# Patient Record
Sex: Female | Born: 1975 | Race: Black or African American | Hispanic: No | Marital: Single | Smoking: Never smoker
Health system: Southern US, Community
[De-identification: ages and names within clinical notes are randomized; demographics above are authoritative.]

## PROBLEM LIST (undated history)

## (undated) DIAGNOSIS — K219 Gastro-esophageal reflux disease without esophagitis: Secondary | ICD-10-CM

## (undated) DIAGNOSIS — IMO0002 Reserved for concepts with insufficient information to code with codable children: Secondary | ICD-10-CM

## (undated) DIAGNOSIS — I10 Essential (primary) hypertension: Secondary | ICD-10-CM

## (undated) DIAGNOSIS — Z9289 Personal history of other medical treatment: Secondary | ICD-10-CM

## (undated) DIAGNOSIS — D631 Anemia in chronic kidney disease: Secondary | ICD-10-CM

## (undated) DIAGNOSIS — N042 Nephrotic syndrome with diffuse membranous glomerulonephritis: Secondary | ICD-10-CM

## (undated) DIAGNOSIS — I770 Arteriovenous fistula, acquired: Secondary | ICD-10-CM

## (undated) DIAGNOSIS — N186 End stage renal disease: Secondary | ICD-10-CM

## (undated) DIAGNOSIS — M329 Systemic lupus erythematosus, unspecified: Secondary | ICD-10-CM

## (undated) DIAGNOSIS — N2581 Secondary hyperparathyroidism of renal origin: Secondary | ICD-10-CM

## (undated) DIAGNOSIS — N289 Disorder of kidney and ureter, unspecified: Secondary | ICD-10-CM

## (undated) DIAGNOSIS — D649 Anemia, unspecified: Secondary | ICD-10-CM

## (undated) DIAGNOSIS — Z940 Kidney transplant status: Secondary | ICD-10-CM

## (undated) DIAGNOSIS — D508 Other iron deficiency anemias: Secondary | ICD-10-CM

## (undated) DIAGNOSIS — R002 Palpitations: Secondary | ICD-10-CM

## (undated) HISTORY — DX: Secondary hyperparathyroidism of renal origin: N25.81

## (undated) HISTORY — PX: TRANSPLANTATION RENAL: SUR1385

## (undated) HISTORY — DX: Kidney transplant status: Z94.0

## (undated) HISTORY — DX: Gastro-esophageal reflux disease without esophagitis: K21.9

## (undated) HISTORY — DX: Arteriovenous fistula, acquired: I77.0

## (undated) HISTORY — PX: DILATION AND CURETTAGE OF UTERUS: SHX78

## (undated) HISTORY — DX: Hypomagnesemia: E83.42

## (undated) HISTORY — PX: ABDOMINAL HYSTERECTOMY: SHX81

---
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
## 2002-03-15 ENCOUNTER — Inpatient Hospital Stay (HOSPITAL_COMMUNITY): Admission: AD | Admit: 2002-03-15 | Discharge: 2002-03-15 | Payer: Self-pay | Admitting: Obstetrics and Gynecology

## 2003-12-02 ENCOUNTER — Emergency Department (HOSPITAL_COMMUNITY): Admission: EM | Admit: 2003-12-02 | Discharge: 2003-12-02 | Payer: Self-pay | Admitting: Emergency Medicine

## 2004-03-18 ENCOUNTER — Emergency Department (HOSPITAL_COMMUNITY): Admission: EM | Admit: 2004-03-18 | Discharge: 2004-03-18 | Payer: Self-pay | Admitting: Family Medicine

## 2005-02-15 ENCOUNTER — Ambulatory Visit: Payer: Self-pay | Admitting: Family Medicine

## 2005-05-03 ENCOUNTER — Emergency Department (HOSPITAL_COMMUNITY): Admission: EM | Admit: 2005-05-03 | Discharge: 2005-05-04 | Payer: Self-pay | Admitting: *Deleted

## 2005-05-26 ENCOUNTER — Ambulatory Visit: Payer: Self-pay | Admitting: Internal Medicine

## 2005-05-27 ENCOUNTER — Ambulatory Visit: Payer: Self-pay | Admitting: Internal Medicine

## 2005-06-01 ENCOUNTER — Ambulatory Visit: Payer: Self-pay | Admitting: Internal Medicine

## 2005-06-01 ENCOUNTER — Inpatient Hospital Stay (HOSPITAL_COMMUNITY): Admission: AD | Admit: 2005-06-01 | Discharge: 2005-06-03 | Payer: Self-pay | Admitting: Internal Medicine

## 2005-06-07 ENCOUNTER — Ambulatory Visit: Payer: Self-pay | Admitting: Hospitalist

## 2005-06-09 ENCOUNTER — Encounter (HOSPITAL_COMMUNITY): Admission: RE | Admit: 2005-06-09 | Discharge: 2005-09-07 | Payer: Self-pay | Admitting: Internal Medicine

## 2005-06-14 ENCOUNTER — Ambulatory Visit: Payer: Self-pay | Admitting: Internal Medicine

## 2005-07-14 ENCOUNTER — Ambulatory Visit: Payer: Self-pay | Admitting: Internal Medicine

## 2005-07-26 ENCOUNTER — Ambulatory Visit (HOSPITAL_COMMUNITY): Admission: RE | Admit: 2005-07-26 | Discharge: 2005-07-26 | Payer: Self-pay | Admitting: Nephrology

## 2005-08-03 ENCOUNTER — Ambulatory Visit (HOSPITAL_COMMUNITY): Admission: RE | Admit: 2005-08-03 | Discharge: 2005-08-04 | Payer: Self-pay | Admitting: Nephrology

## 2005-08-03 ENCOUNTER — Encounter (INDEPENDENT_AMBULATORY_CARE_PROVIDER_SITE_OTHER): Payer: Self-pay | Admitting: *Deleted

## 2005-09-08 ENCOUNTER — Encounter (HOSPITAL_COMMUNITY): Admission: RE | Admit: 2005-09-08 | Discharge: 2005-12-07 | Payer: Self-pay | Admitting: Internal Medicine

## 2005-10-19 ENCOUNTER — Ambulatory Visit: Payer: Self-pay | Admitting: Internal Medicine

## 2005-11-04 ENCOUNTER — Ambulatory Visit: Payer: Self-pay | Admitting: Internal Medicine

## 2005-12-08 ENCOUNTER — Encounter (HOSPITAL_COMMUNITY): Admission: RE | Admit: 2005-12-08 | Discharge: 2006-03-08 | Payer: Self-pay | Admitting: Nephrology

## 2006-06-14 DIAGNOSIS — D631 Anemia in chronic kidney disease: Secondary | ICD-10-CM

## 2006-06-14 DIAGNOSIS — N039 Chronic nephritic syndrome with unspecified morphologic changes: Secondary | ICD-10-CM

## 2006-06-14 HISTORY — DX: Anemia in chronic kidney disease: D63.1

## 2006-08-14 ENCOUNTER — Encounter (INDEPENDENT_AMBULATORY_CARE_PROVIDER_SITE_OTHER): Payer: Self-pay | Admitting: Internal Medicine

## 2006-08-14 ENCOUNTER — Ambulatory Visit: Payer: Self-pay | Admitting: *Deleted

## 2006-08-14 LAB — CONVERTED CEMR LAB
BUN: 13 mg/dL (ref 6–23)
CO2: 23 meq/L (ref 19–32)
Calcium: 7.6 mg/dL — ABNORMAL LOW (ref 8.4–10.5)
Chloride: 113 meq/L — ABNORMAL HIGH (ref 96–112)
Creatinine, Ser: 1.17 mg/dL (ref 0.40–1.20)
Ferritin: 9 ng/mL — ABNORMAL LOW (ref 10–291)
Glucose, Bld: 83 mg/dL (ref 70–99)
HCT: 28.3 % — ABNORMAL LOW (ref 34.4–43.3)
Hemoglobin: 8.4 g/dL — ABNORMAL LOW (ref 11.7–14.8)
Iron: <10 ug/dL — ABNORMAL LOW (ref 42–145)
MCHC: 29.7 g/dL — ABNORMAL LOW (ref 33.1–35.4)
MCV: 75.9 fL — ABNORMAL LOW (ref 78.8–100.0)
Platelets: 282 10*3/uL (ref 152–374)
Potassium: 4.8 meq/L (ref 3.5–5.3)
RBC: 3.73 M/uL — ABNORMAL LOW (ref 3.79–4.96)
RDW: 17.5 % — ABNORMAL HIGH (ref 11.5–15.3)
Sodium: 139 meq/L (ref 135–145)
UIBC: 187 ug/dL
WBC: 3.1 10*3/uL — ABNORMAL LOW (ref 3.7–10.0)

## 2006-08-21 DIAGNOSIS — N042 Nephrotic syndrome with diffuse membranous glomerulonephritis, unspecified: Secondary | ICD-10-CM

## 2006-08-21 DIAGNOSIS — M329 Systemic lupus erythematosus, unspecified: Secondary | ICD-10-CM | POA: Insufficient documentation

## 2006-08-21 DIAGNOSIS — D508 Other iron deficiency anemias: Secondary | ICD-10-CM | POA: Insufficient documentation

## 2006-08-21 HISTORY — DX: Other iron deficiency anemias: D50.8

## 2006-08-21 HISTORY — DX: Nephrotic syndrome with diffuse membranous glomerulonephritis: N04.2

## 2006-08-21 HISTORY — DX: Nephrotic syndrome with diffuse membranous glomerulonephritis, unspecified: N04.20

## 2006-08-21 HISTORY — DX: Systemic lupus erythematosus, unspecified: M32.9

## 2007-01-18 ENCOUNTER — Encounter (HOSPITAL_COMMUNITY): Admission: RE | Admit: 2007-01-18 | Discharge: 2007-04-18 | Payer: Self-pay | Admitting: Nephrology

## 2007-06-19 ENCOUNTER — Emergency Department (HOSPITAL_COMMUNITY): Admission: EM | Admit: 2007-06-19 | Discharge: 2007-06-19 | Payer: Self-pay | Admitting: Emergency Medicine

## 2008-05-29 ENCOUNTER — Encounter (HOSPITAL_COMMUNITY): Admission: RE | Admit: 2008-05-29 | Discharge: 2008-08-27 | Payer: Self-pay | Admitting: Nephrology

## 2008-06-19 ENCOUNTER — Ambulatory Visit: Payer: Self-pay | Admitting: Gastroenterology

## 2008-10-21 ENCOUNTER — Encounter (HOSPITAL_COMMUNITY): Admission: RE | Admit: 2008-10-21 | Discharge: 2009-01-19 | Payer: Self-pay | Admitting: Nephrology

## 2008-10-28 ENCOUNTER — Other Ambulatory Visit: Admission: RE | Admit: 2008-10-28 | Discharge: 2008-10-28 | Payer: Self-pay | Admitting: Obstetrics and Gynecology

## 2009-01-22 ENCOUNTER — Ambulatory Visit (HOSPITAL_COMMUNITY): Admission: RE | Admit: 2009-01-22 | Discharge: 2009-01-22 | Payer: Self-pay | Admitting: Obstetrics and Gynecology

## 2009-01-22 ENCOUNTER — Encounter (INDEPENDENT_AMBULATORY_CARE_PROVIDER_SITE_OTHER): Payer: Self-pay | Admitting: Obstetrics and Gynecology

## 2009-03-18 ENCOUNTER — Encounter (HOSPITAL_COMMUNITY): Admission: RE | Admit: 2009-03-18 | Discharge: 2009-06-16 | Payer: Self-pay | Admitting: Nephrology

## 2009-06-25 ENCOUNTER — Encounter (HOSPITAL_COMMUNITY): Admission: RE | Admit: 2009-06-25 | Discharge: 2009-09-23 | Payer: Self-pay | Admitting: Nephrology

## 2010-07-16 ENCOUNTER — Ambulatory Visit: Payer: Self-pay | Admitting: Vascular Surgery

## 2010-08-25 ENCOUNTER — Ambulatory Visit (HOSPITAL_COMMUNITY)
Admission: RE | Admit: 2010-08-25 | Discharge: 2010-08-25 | Payer: Self-pay | Source: Home / Self Care | Attending: Vascular Surgery | Admitting: Vascular Surgery

## 2010-08-25 HISTORY — PX: AV FISTULA PLACEMENT: SHX1204

## 2010-09-24 ENCOUNTER — Ambulatory Visit
Admission: RE | Admit: 2010-09-24 | Discharge: 2010-09-24 | Payer: Self-pay | Source: Home / Self Care | Attending: Vascular Surgery | Admitting: Vascular Surgery

## 2010-09-24 NOTE — Assessment & Plan Note (Addendum)
OFFICE VISIT  COLLEENE, SWARTHOUT DOB:  07/08/1976                                       09/24/2010 UVOZD#:[id number]  This is a postop followup.  HISTORY OF PRESENT ILLNESS:  This is a 35 year old female that underwent a left brachiocephalic arteriovenous fistula on 08/25/2010 who presents for followup evaluation.  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 Our plan was to try to get a working fistula in her left arm. Unfortunately, it looks like the left cephalic vein in the upper arm was noted to be marginal and on the intraoperative exploration there was a sclerotic 3 to 4 mm cephalic vein that I felt was still worth attempting.  At this point the patient is having intermittently some numbness in her left forearm but no fingertip numbness and is able to complete her activities of daily living without any problems.  PHYSICAL EXAMINATION:  She had no vital signs obtained.  On focused exam her left arm, she has full hand grip with intact sensation in her fingertips in this left hand.  The incision in this left arm as well healed.  There is a palpable thrill at the antecubitum, however, I do not feel a thrill elsewhere.  Also, I hear a weak bruit in the upper arm not to surprised giving the obesity she has in her upper arm.  I obtained a SonoSite to evaluate this brachiocephalic arteriovenous fistula.  Evaluating the entire length of this vein it appears to be widely patent but still very small.  There are some segments that remain only about 3 mm in diameter.  There is no thrombus throughout this arteriovenous fistula.  However, at some areas this vein is more than 2 inches deep.  MEDICAL DECISION MAKING:  This is a 35 year old female with a patent left brachiocephalic arteriovenous fistula.  Unfortunately this fistula does not appear to be maturing adequately additionally.   Additionally it is quite deep, at some points greater than 2 cm so even if it does mature she will need a superficialization procedure.  At this point luckily she is still in chronic kidney disease stage 3 and so does not immediately need any intervention.  I would just continue to let her mature.  She is going to follow up in another month and then I will have her access evaluated in the peripheral lab.  I would not be surprised if this access thromboses given that at some areas it is only 3 mm in diameter.  I discussed this with the patient and she understands the plan.  We do not intend to place any graft in this patient until she is within 2 to 3 months of needing hemodialysis.    Fransisco Hertz, MD Electronically Signed  BLC/MEDQ  D:  09/24/2010  T:  09/24/2010  Job:  [id number]

## 2010-10-22 ENCOUNTER — Encounter (INDEPENDENT_AMBULATORY_CARE_PROVIDER_SITE_OTHER): Payer: Self-pay

## 2010-10-22 ENCOUNTER — Ambulatory Visit (INDEPENDENT_AMBULATORY_CARE_PROVIDER_SITE_OTHER): Payer: Self-pay | Admitting: Vascular Surgery

## 2010-10-22 DIAGNOSIS — T82598A Other mechanical complication of other cardiac and vascular devices and implants, initial encounter: Secondary | ICD-10-CM

## 2010-10-22 DIAGNOSIS — N186 End stage renal disease: Secondary | ICD-10-CM

## 2010-10-22 NOTE — Assessment & Plan Note (Signed)
OFFICE VISIT  Cynthia Pugh, Cynthia Pugh DOB:  February 11, 1976                                       10/22/2010 ZOXWR#:[id number]  This is a postop followup.  HISTORY OF PRESENT ILLNESS:  This is a 35 year old female that previously underwent a left brachiocephalic arteriovenous fistula at the end of December 2011.  I had seen her previously in January for postop followup on her fistula.  At that point there were segments that remained about 3 mm in diameter and I wanted to give her some additional time to dilate further.  However, on evaluation today now it appears to be occluded.  She has had no steal symptoms and the hand has some radial side numbness in the skin occasionally.  She is able to complete her activities of daily living without any difficulties.  PHYSICAL EXAMINATION:  Vital signs:  Today she had a temperature 97.6, blood pressure 137/99, a heart rate of 80, respirations were 20.  On focused examination of the left arm her incision has healed up well. There is 5/5 hand grip in this left arm with sensation intact in her fingers.  She has good intrinsic extrinsic muscle strength in this hand. I do not feel a thrill throughout this fistula at this point.  Additionally on noninvasive vascular imaging there was a left arm fistula duplex completed.  There is a focal area of thrombosis near the arterial anastomosis though most of the vein appears to be patent.  MEDICAL DECISION MAKING:  This is a 35 year old female status post left brachiocephalic arteriovenous fistula.  There appears to be an occlusion at the arterial anastomoses.  Reviewing the operative notes this would correspond to the sclerotic vein segment that I had to harvest in her case to achieve adequate length for this fistula.  While it may be possible to thrombolize this my suspicion is that eventually it would resclerose as this is fundamentally damaged vein.  So unfortunately I would consider  this a failed brachiocephalic arteriovenous fistula attempt and another attempt would be futile as eventually the damaged vein will once again sclerose.  In reviewing her previous vein mapping she has no other fistula options I believe in the left arm.  The right arm had a possibility of basilic vein transposition and so subsequently I would proceed forward with a right arm venogram with dilute dye and central venogram to determine whether this is indeed a realistic option in this patient.  She is currently not on end-stage renal and based on her previous numbers was not even in chronic kidney disease stage III so we have some time to determine whether or not this basilic vein is an option.  I am going to schedule her for Thursday March 8 for venogram to determine whether this is a feasible fistula option.  If we are left with no fistula options then at that point I would await her renal disease progressing until she is 1-2 months out from needing dialysis so at that point we can place arteriovenous graft given the limited patencies of graft in these patients.  Thank you for giving [place] the opportunity to participate in this patient's care.    Fransisco Hertz, MD Electronically Signed  BLC/MEDQ  D:  10/22/2010  T:  10/22/2010  Job:  [id number]

## 2010-10-28 NOTE — Procedures (Unsigned)
VASCULAR LAB EXAM  INDICATION:  Nonmaturing AV fistula.  HISTORY: Diabetes: Cardiac: Hypertension:  EXAM:  Left upper arm AV fistula duplex.  IMPRESSION: 1. The left brachial to cephalic arteriovenous fistula does not appear     to be patent at the antecubital fossa of the outflow vein with the     remainder of the outflow vein throughout the left brachium level     appearing patent with continuous venous flow.  The left     arteriovenous fistula anastomosis demonstrates turbulent to-and-fro     flow. 2. Diameter, depth and velocity measurements are noted on the attached     worksheet.  ___________________________________________ Fransisco Hertz, MD  CH/MEDQ  D:  10/22/2010  T:  10/22/2010  Job:  [id number]

## 2010-11-04 ENCOUNTER — Ambulatory Visit (HOSPITAL_COMMUNITY)
Admission: RE | Admit: 2010-11-04 | Discharge: 2010-11-04 | Disposition: A | Discharge status: Home / Self Care | Payer: Self-pay | Source: Ambulatory Visit | Attending: Vascular Surgery | Admitting: Vascular Surgery

## 2010-11-04 DIAGNOSIS — M329 Systemic lupus erythematosus, unspecified: Secondary | ICD-10-CM | POA: Insufficient documentation

## 2010-11-04 DIAGNOSIS — N039 Chronic nephritic syndrome with unspecified morphologic changes: Secondary | ICD-10-CM | POA: Insufficient documentation

## 2010-11-04 DIAGNOSIS — N186 End stage renal disease: Secondary | ICD-10-CM

## 2010-11-04 DIAGNOSIS — N183 Chronic kidney disease, stage 3 unspecified: Secondary | ICD-10-CM | POA: Insufficient documentation

## 2010-11-04 DIAGNOSIS — I120 Hypertensive chronic kidney disease with stage 5 chronic kidney disease or end stage renal disease: Secondary | ICD-10-CM

## 2010-11-04 LAB — POCT I-STAT, CHEM 8
Chloride: 112 mEq/L (ref 96–112)
Creatinine, Ser: 2.6 mg/dL — ABNORMAL HIGH (ref 0.4–1.2)
Glucose, Bld: 87 mg/dL (ref 70–99)
HCT: 33 % — ABNORMAL LOW (ref 36.0–46.0)
Hemoglobin: 11.2 g/dL — ABNORMAL LOW (ref 12.0–15.0)
Sodium: 139 mEq/L (ref 135–145)
TCO2: 19 mmol/L (ref 0–100)

## 2010-11-07 NOTE — Op Note (Signed)
[name], Cynthia Pugh NO.:  [id number]  MEDICAL RECORD NO.:  [id number]           PATIENT TYPE:  O  LOCATION:  [facility]                         FACILITY:  [facility]  PHYSICIAN:  Fransisco Hertz, MD       DATE OF BIRTH:  1975/12/25  DATE OF PROCEDURE:  11/04/2010 DATE OF DISCHARGE:  11/04/2010                              OPERATIVE REPORT   PROCEDURE:  Right arm and central venogram.  PREOPERATIVE DIAGNOSIS:  Chronic kidney disease, stage III.  POSTOPERATIVE DIAGNOSIS:  Chronic kidney disease, stage III.  SURGEON:  Fransisco Hertz, MD  ANESTHESIA:  None.  CONTRAST:  20 mL.  ESTIMATED BLOOD LOSS:  Minimal.  FINDINGS: 1. No cephalic vein filling. 2. Distal brachial veins are patent distally, with one occluding half way up     the arm, this is the one that the basilic vein drains into.  The other     one tapers down to less than 1 mm for for a 2-3 cm segment  proximally. 3. Basilic vein drains into the one of the brachial veins about third     to the way up the arm. 4. The axillary and subclavian veins on the right side are patent. 5. There is a dilated proximal subclavian vein which obscures the central     venous flow, but the innominate and superior vena cava appeared to     be patent.  INDICATIONS:  This is a 35 year old female who has chronic kidney disease related to lupus.  She previously has undergone  a left brachiocephalic arteriovenous fistula which initially was patent, but eventually thrombosed.  Unfortunately, she has no other veins of adequate diameter in this left arm.  Prior to proceeding with a basilic vein transposition on the right arm, I felt it was good idea to make an attempt at imaging this vein, so she was brought back to the angio suite today for right arm venogram and central venography.  The patient is aware of the risks of this procedure including anaphylactic reaction to the dye and possible exacerbation of her renal failure with the  contrast dye.  She is aware of these risks and agreed to proceed forward such.  DESCRIPTION OF OPERATION:  After full informed written consent had been obtained from the patient, she was brought back to the angio suite and placed supine upon the angio table.  She was connected to monitoring equipment.  Her previously placed right hand IV was connected to IV extension tubing.  Under fluoroscopic imaging, hand injections were completed to image the upper arm and the central veins, findings of which were as listed above.  Based on these findings, I do not think that a basilic vein transposition would successively mature in her as the basilic vein is drains into the specific brachial vein and drains into its thrombosed more proximally.  So, our only option in this arm is also to proceed forward with graft and subsequently we will hold off on any access until she nears end-stage renal disease.  COMPLICATIONS:  None.  CONDITION:  Stable.     Fransisco Hertz,  MD     BLC/MEDQ  D:  11/04/2010  T:  11/05/2010  Job:  [id number] Electronically Signed by Leonides Sake MD on 11/07/2010 06:33:38 PM

## 2010-11-08 LAB — POCT I-STAT 4, (NA,K, GLUC, HGB,HCT)
Glucose, Bld: 95 mg/dL (ref 70–99)
HCT: 33 % — ABNORMAL LOW (ref 36.0–46.0)
Hemoglobin: 11.2 g/dL — ABNORMAL LOW (ref 12.0–15.0)
Potassium: 4.3 mEq/L (ref 3.5–5.1)
Sodium: 143 mEq/L (ref 135–145)

## 2010-11-08 LAB — SURGICAL PCR SCREEN
MRSA, PCR: NEGATIVE
Staphylococcus aureus: NEGATIVE

## 2010-12-02 LAB — IRON AND TIBC
Iron: 85 ug/dL (ref 42–135)
Saturation Ratios: 30 % (ref 20–55)
TIBC: 284 ug/dL (ref 250–470)
UIBC: 199 ug/dL

## 2010-12-02 LAB — POCT HEMOGLOBIN-HEMACUE
Hemoglobin: 13.1 g/dL (ref 12.0–15.0)
Hemoglobin: 11.2 g/dL — ABNORMAL LOW (ref 12.0–15.0)
Hemoglobin: 11.6 g/dL — ABNORMAL LOW (ref 12.0–15.0)

## 2010-12-02 LAB — FERRITIN: Ferritin: 71 ng/mL (ref 10–291)

## 2010-12-03 LAB — SICKLE CELL SCREEN: Sickle Cell Screen: NEGATIVE

## 2010-12-03 LAB — POCT HEMOGLOBIN-HEMACUE: Hemoglobin: 9.3 g/dL — ABNORMAL LOW (ref 12.0–15.0)

## 2010-12-05 LAB — POCT HEMOGLOBIN-HEMACUE
Hemoglobin: 9.3 g/dL — ABNORMAL LOW (ref 12.0–15.0)
Hemoglobin: 9.4 g/dL — ABNORMAL LOW (ref 12.0–15.0)

## 2010-12-07 LAB — ABO/RH: ABO/RH(D): B POS

## 2010-12-07 LAB — TYPE AND SCREEN
ABO/RH(D): B POS
Antibody Screen: NEGATIVE

## 2010-12-07 LAB — BASIC METABOLIC PANEL WITH GFR
BUN: 15 mg/dL (ref 6–23)
CO2: 21 meq/L (ref 19–32)
Calcium: 8.3 mg/dL — ABNORMAL LOW (ref 8.4–10.5)
Chloride: 114 meq/L — ABNORMAL HIGH (ref 96–112)
Creatinine, Ser: 1.61 mg/dL — ABNORMAL HIGH (ref 0.4–1.2)
GFR calc non Af Amer: 38 mL/min — ABNORMAL LOW
Glucose, Bld: 101 mg/dL — ABNORMAL HIGH (ref 70–99)
Potassium: 3.8 meq/L (ref 3.5–5.1)
Sodium: 140 meq/L (ref 135–145)

## 2010-12-07 LAB — CBC
Hemoglobin: 7.2 g/dL — CL (ref 12.0–15.0)
MCHC: 32.7 g/dL (ref 30.0–36.0)
Platelets: 324 10*3/uL (ref 150–400)
RDW: 17.9 % — ABNORMAL HIGH (ref 11.5–15.5)

## 2010-12-07 LAB — PROTIME-INR: INR: 1.1 (ref 0.00–1.49)

## 2010-12-07 LAB — APTT: aPTT: 28 seconds (ref 24–37)

## 2010-12-07 LAB — HCG, SERUM, QUALITATIVE: Preg, Serum: NEGATIVE

## 2010-12-08 LAB — POCT HEMOGLOBIN-HEMACUE: Hemoglobin: 7.7 g/dL — CL (ref 12.0–15.0)

## 2010-12-09 LAB — CBC
Hemoglobin: 9.3 g/dL — ABNORMAL LOW (ref 12.0–15.0)
MCHC: 33 g/dL (ref 30.0–36.0)
MCV: 72.3 fL — ABNORMAL LOW (ref 78.0–100.0)
RBC: 3.91 MIL/uL (ref 3.87–5.11)
WBC: 6.1 10*3/uL (ref 4.0–10.5)

## 2010-12-09 LAB — RENAL FUNCTION PANEL
CO2: 19 mEq/L (ref 19–32)
Chloride: 115 mEq/L — ABNORMAL HIGH (ref 96–112)
Creatinine, Ser: 1.58 mg/dL — ABNORMAL HIGH (ref 0.4–1.2)
GFR calc Af Amer: 46 mL/min — ABNORMAL LOW (ref >60)
GFR calc non Af Amer: 38 mL/min — ABNORMAL LOW (ref >60)
Glucose, Bld: 103 mg/dL — ABNORMAL HIGH (ref 70–99)
Sodium: 138 mEq/L (ref 135–145)

## 2010-12-09 LAB — POCT HEMOGLOBIN-HEMACUE: Hemoglobin: 9 g/dL — ABNORMAL LOW (ref 12.0–15.0)

## 2010-12-14 LAB — CROSSMATCH: Antibody Screen: NEGATIVE

## 2010-12-14 LAB — RENAL FUNCTION PANEL
Albumin: 2.6 g/dL — ABNORMAL LOW (ref 3.5–5.2)
Calcium: 8 mg/dL — ABNORMAL LOW (ref 8.4–10.5)
Creatinine, Ser: 1.88 mg/dL — ABNORMAL HIGH (ref 0.4–1.2)
GFR calc Af Amer: 38 mL/min — ABNORMAL LOW (ref >60)
GFR calc non Af Amer: 31 mL/min — ABNORMAL LOW (ref >60)
Phosphorus: 3.9 mg/dL (ref 2.3–4.6)
Sodium: 138 mEq/L (ref 135–145)

## 2010-12-14 LAB — POCT HEMOGLOBIN-HEMACUE: Hemoglobin: 6.4 g/dL — CL (ref 12.0–15.0)

## 2011-01-11 NOTE — Procedures (Signed)
CEPHALIC VEIN MAPPING   INDICATION:  Preoperative AVG placement.   HISTORY:  End-stage renal disease.   EXAM:  The right cephalic vein is compressible with diameter measurements  ranging from 0.16 to 0.23 cm.   The right basilic vein is compressible with diameter measurements  ranging from 0.29 to 0.34 cm.   The left cephalic vein is compressible with diameter measurements  ranging from 0.19 to 0.31 cm.   The left basilic vein is compressible with diameter measurements ranging  from 0.20 to 0.26 cm.   See attached worksheet for all measurements.   IMPRESSION:  Patent right and left cephalic and basilic veins with  diameter measurements, as described above.   ___________________________________________  Leonides Sake, MD   EM/MEDQ  D:  07/16/2010  T:  07/16/2010  Job:  [id number]

## 2011-01-11 NOTE — Consult Note (Signed)
NEW PATIENT CONSULTATION   Pugh, Cynthia  DOB:  [birth year]                                       07/16/2010  ZOXWR#:[id number]   REASON FOR REQUEST:  Placement of new access.   HISTORY OF PRESENT ILLNESS:  This is a 35 year old female, left hand-  dominant, who presents with chronic kidney disease stage III.  Presents  with chief a complaint of evaluation for new access.  This patient never  has had any access, no previous central venous catheter.  By report she  has a combination issues resulting in her kidney failure including  membranous nephropathy and nephrotic syndrome.  While initially her  baseline creatinine was 1.4, now at the most recent creatinine was up to  2.4, which corresponded to an estimated GFR of 30 mL per minute.  Dr.  Hyman Hopes refers this patient to [place] for evaluation for possible fistula  placement.  At this point she mentioned she notes no problems in terms  of any sensory deficiencies in either hand, no motor deficiencies and  denies any problems of any type of bleeding disorders.   PAST MEDICAL HISTORY:  1. Membranous nephropathy.  2. Endometrial precancerous lesion.  3. Hypertension.  4. Secondary hyperparathyroidism.  5. Nephrotic syndrome.  6. Hyperlipidemia.   PAST SURGICAL HISTORY:  By report she has had a hysterectomy of some  type.   SOCIAL HISTORY:  She denies any tobacco, alcohol or illicit drug use.   FAMILY HISTORY:  She was not able to remember if her father or mother  had any medical problems.   MEDICATIONS:  She did not note being on any medications and she notes  her only allergy to be aspirin.   REVIEW OF SYSTEMS:  She noted anemia, kidney disease and frequent  urination.   PHYSICAL EXAMINATION:  VITAL SIGNS:  Blood pressure 135/95, a heart rate  of 92, respirations were 12.  GENERAL:  She is alert and oriented x3.  She is obese.  Otherwise well-  developed.  HEAD:  Normocephalic, atraumatic.  ENT:  Hearing is  grossly intact.  Nares without any erythema or  drainage.  OROPHARYNX:  Without any erythema or exudate.  EYES:  Pupils equal, round, reactive to light.  Extraocular movements  are intact.  NECK:  Supple.  No nuchal rigidity.  No palpable lymphadenopathy.  PULMONARY:  Symmetric expansion, good air movement.  Clear to  auscultation bilaterally.  No rales, rhonchi or wheezing.  CARDIAC:  Regular rate and rhythm.  Normal S1 and S2.  No murmurs, rubs,  thrills or gallops.  VASCULAR:  She had palpable pulses in all extremities.  Her carotids did  not have any carotid bruits and were easily palpable.  I could not  appreciate her aorta due to her obesity.  GI:  Soft, nontender, nondistended, large pannus.  No guarding or  rebound.  No hepatosplenomegaly.  No masses.  No costovertebral angle  tenderness.  MUSCULOSKELETAL:  All extremities had 5/5 strength.  There was no  evidence of ulcerations or gangrene in any extremity.  NEUROLOGIC:  Cranial nerves II-XII were intact.  Pain and light touch  were intact in all extremities.  She had good sensation in her hands  with good intrinsic, extrinsic motor strength in the hands.  Motor exam  was as listed above.  PSYCH:  Judgment was intact.  Her mood and affect were appropriate for  her clinical situation.  SKIN:  She did not have any rashes elsewhere.  The extremities were as  listed above.  LYMPHATIC:  She had no cervical, axillary or inguinal lymphadenopathy.   NONINVASIVE VASCULAR IMAGING:  She had bilateral upper extremity vein  mapping.  On the right side her cephalic vein is inadequate for any  fistula.  The basilic vein also is marginal for a basilic vein  transposition.  On her left side, her dominant side, the cephalic vein  in the upper arm ranges 0.27 to 0.31, may be a suitable possibly for a  brachiocephalic arteriovenous fistula.  The basilic vein on the left  side is inadequate for a BVT.   MEDICAL DECISION-MAKING:  This is a  35 year old female who is now  chronic kidney disease stage III due to her noncompliance.  Per her  nephrologist, the patient is going to eventually require dialysis and  would like a fistula placement.  Unfortunately, her veins are extremely  poor.  Her only option is a brachiocephalic in the left arm.  Unfortunately, this vein also is a little bit marginal.  I think it is  worth trying to place the fistula in her left arm.  The other concern is  going to be due to her obesity, while we may be to get a viable  brachiocephalic fistula, I suspect she is still going to require a  second-stage superficialization of this vein, but we discussed our  options her.  I would do only a brachiocephalic arteriovenous fistula  because her renal function has not deteriorate to the point where she  requires hemodialysis in the next two months.  If she looks like she is  going to be within 2 months of needing a dialysis, at that point if she  needs a graft then we will place the graft due to the limitations of  durability with arteriovenous grafts.  We just had an extensive  discussion of the risks of access surgery, which included but were not  limited to nerve damage, steal syndrome, ischemic monomelic neuropathy,  infection, possible bleeding, non-maturity and possible need for  additional procedures.  She is aware of such.  She is going to discuss  this with Dr. Hyman Hopes and when she feels comfortable proceeding, she is  going to let us know and we will place a left brachiocephalic  arteriovenous fistula.     Leonides Sake, MD  Electronically Signed   BC/MEDQ  D:  07/16/2010  T:  07/19/2010  Job:  [id number]

## 2011-01-11 NOTE — Op Note (Signed)
NAMEBUELAH, Cynthia NO.:  [id number]   MEDICAL RECORD NO.:  [id number]          PATIENT TYPE:  AMB   LOCATION:  [facility]                           FACILITY:  [facility]   PHYSICIAN:  Gerald Leitz, MD          DATE OF BIRTH:  1975-10-05   DATE OF PROCEDURE:  01/22/2009  DATE OF DISCHARGE:  01/22/2009                               OPERATIVE REPORT   PREOPERATIVE DIAGNOSES:  1. Menorrhagia.  2. Endometrial polyps.  3. Anemia.   POSTOPERATIVE DIAGNOSES:  1. Menorrhagia.  2. Endocervical polyp.  3. Anemia.   PROCEDURE:  Hysteroscopy, dilatation and curettage, removal of  endocervical polyp.   SURGEON:  Gerald Leitz, MD   ASSISTANT:  None.   ANESTHESIA:  General.   FINDINGS:  Two large endocervical polyps, one approximately 2.5 cm in  length.   SPECIMENS:  Endocervical polyps and endometrial curettings.   DISPOSITION OF SPECIMEN:  Pathology.   ESTIMATED BLOOD LOSS:  150 mL.   COMPLICATIONS:  None.   SORBITOL DEFICIT:  100 mL.   PROCEDURE IN DETAIL:  The patient was taken to the operating room where  she was placed under general anesthesia, she was placed in a dorsal  lithotomy position and prepped and draped in the usual sterile fashion.  A speculum was placed into the vaginal vault.  The anterior lip of the  cervix was grasped with a single-tooth tenaculum.  The uterus was then  sounded to 8 cm.  The cervix was dilated to approximately 6 mm and the  diagnostic hysteroscope was inserted without difficulty with the  findings noted above.  Endoloop was placed around the large endocervical  polyp to help with hemostasis, and the endometrial polyp was then  transected and handed off.  The small endocervical polyp was removed  with polyp forceps without difficulty.  Sharp curettage was performed  until a gritty texture was noted.  Hysteroscope was reinserted.  No  evidence of perforation was noted.  Single-tooth tenaculum was removed.  The patient was then noted to  have some brisk bleeding from the  tenaculum site.  There was also bleeding from the endocervical site.  Pressure was applied.  Hemostasis was not noted at this point.  Silver  nitrate was applied to the tenaculum site.  Again hemostasis failed.  Stitch of 2-0 Vicryl was placed along the tenaculum site with continued  bleeding and oozing from the anterior cervical lip.  Monsel was applied  and then excellent hemostasis was noted.  The speculum was removed from  the vagina.  The patient was taken to recovery room awake and in stable  condition.      Gerald Leitz, MD  Electronically Signed     TC/MEDQ  D:  01/22/2009  T:  01/22/2009  Job:  [id number]

## 2011-01-14 NOTE — Group Therapy Note (Signed)
NAME:  Cynthia Pugh, Cynthia Pugh NO.:  [id number]   MEDICAL RECORD NO.:  [id number]          PATIENT TYPE:  WOC   LOCATION:  [facility]                   FACILITY:  [facility]   PHYSICIAN:  Tinnie Gens, MD        DATE OF BIRTH:  18-Jun-1976   DATE OF SERVICE:                                    CLINIC NOTE   CHIEF COMPLAINT:  Yearly exam.   HISTORY OF PRESENT ILLNESS:  The patient is a 35 year old nulligravida  virgin who is self referred.  She comes in needing a Pap smear.  It has been  a few years since she had one, but she is desiring this exam.  With her is a  very dear friend who says they really want her to be checked for lupus.  Apparently, her sister died of systemic lupus in December 12, 2022, and this was an  identical twin.  The patient states that her sister had lung involvement,  and was significantly short of breath.  The patient herself has no  significant shortness of breath.   PAST MEDICAL HISTORY:  Negative.   PAST SURGICAL HISTORY:  Also negative.   MEDICATIONS:  None.   ALLERGIES:  To aspirin.   OBSTETRICAL HISTORY:  G0.   GYNECOLOGICAL HISTORY:  Menarche at age 78.  Cycles every 25-30 days with  heavy flow the first 2 days and pain with no history of abnormal Paps.   FAMILY HISTORY:  Significant only for lupus.  Otherwise is unknown as she  and her sister were adopted.   SOCIAL HISTORY:  No tobacco, alcohol or drug use.   REVIEW OF SYSTEMS:  A 14-point review of systems was reviewed.  She states  she has some muscle cramps at night in her legs, and occasionally has a  dizzy spell.   PHYSICAL EXAMINATION:  VITAL SIGNS:  Her vitals are as noted on the chart.  GENERAL:  She is a well-developed, well-nourished black female in no acute  distress.  GU:  She has an intact hymenal ring.  The vagina is pink and rugated.  The  cervix is without lesions.  The uterus on one-finger digital exam is small  and anteverted.  There are no adnexal masses or tenderness.   IMPRESSION:  1.  Yearly exam.  2.  Heavy cycles.  3.  Strong family history of lupus.   PLAN:  1.  Pap smear today.  2.  Start her on Lorcet 1 p.o. daily.  3.  Check ANA today.  If her ANA comes back positive, we will refer her to      internal medicine.  Otherwise the patient should follow up in a year for      a refill on her pills or sooner if she is having problems with that.  4.  Other medical problems should be followed by an internist as well.       TP/MEDQ  D:  02/15/2005  T:  02/15/2005  Job:  [id number]

## 2011-01-14 NOTE — Discharge Summary (Signed)
NAMEANDEE, CHIVERS NO.:  [id number]   MEDICAL RECORD NO.:  [id number]          PATIENT TYPE:  INP   LOCATION:  3037                         FACILITY:  [facility]   PHYSICIAN:  Duncan Dull, M.D.     DATE OF BIRTH:  Jan 13, 1976   DATE OF ADMISSION:  06/01/2005  DATE OF DISCHARGE:  06/03/2005                                 DISCHARGE SUMMARY   DISCHARGE DIAGNOSES:  1.  Anemia.  2.  Systemic lupus erythematosus.  3.  Renal insufficiency.  4.  Hypokalemia.  5.  Menorrhagia.   DISCHARGE MEDICATION:  Ferrous sulfate 325 mg p.o. t.i.d.   DISPOSITION AND FOLLOW-UP:  Discharged to home in improved condition.  Follow-up is with Dr. Renae Fickle.  At that time labs were to be checked.  A CBC  and BMP to be drawn.   PROCEDURES PERFORMED:  1.  Pelvic ultrasound on June 03, 2005:  Small bilateral benign appearing      ovarian cyst.  2.  Renal ultrasound June 03, 2005:  Slight enlarged bilateral kidneys,      otherwise normal.   CONSULTATIONS:  None.   ADMISSION HISTORY AND PHYSICAL:   HISTORY OF PRESENT ILLNESS:  Patient is a 35 year old African American  female with history of anemia and heavy menstrual bleeding presenting with  worsening anemia.  Patient is a new patient of the medical clinic.  After  first visit, patient was evaluated for possible coagulopathy which was  negative and ESR checked for possible lupus as her twin sister passed away  in 12-09-2004, for multiple organ system failure due to lupus.  Patient  states that she has had long history of anemia, has been told this as a  teenager.  Patient passed out in January 2006, was evaluated at ER and  discharged with iron supplements which she admits to not being compliant  with.  Currently the patient complains of fatigue, occasional shortness of  breath, dizziness, and currently on her menstrual period with heavy  bleeding.  States she goes through four to six pads per day and flow is  heaviest on the  second and third days.  She has had heavy bleeding with  periods since menarche and was on birth control as a teenager for two to  three years to control flow.  She is currently not on birth control and her  flow is heavy but regular.  Denies palpitations, chest pain, fever, chills,  blood in urine or stool.  She has no pain, nausea or vomiting.  She has a  poor appetite and cold intolerance.   ALLERGIES:  ASPIRIN, reaction is unknown.   PAST MEDICAL HISTORY:  Possible bronchitis in May 04, 2004.   PHYSICAL EXAMINATION ON ADMISSION:  GENERAL APPEARANCE:  She is without  distress.  She appeared somewhat fatigued.  VITAL SIGNS:  Temperature 98.7, blood pressure 116/71, pulse 105,  respiratory rate 16.  HEENT:  Pupils are equal, round, reactive to light and accommodation.  Her  extraocular muscles were intact.  ENT:  Poor dentition.  NECK:  Supple.  RESPIRATORY:  Clear to  auscultation bilaterally.  CARDIOVASCULAR:  Tachycardic, slight systolic ejection murmur.  ABDOMEN:  Soft, nontender and nondistended with positive bowel sounds.  EXTREMITIES:  No edema, 2+ pulses, 2+ reflexes, multiple scars in the upper  extremity bilaterally.  SKIN:  Somewhat pale.  LYMPHS:  No cervical adenopathy.  MUSCULOSKELETAL:  No muscle atrophy, weakness, normal gait.  NEUROLOGIC:  She was alert and oriented to person, place and time.  Cranial  nerves II-XII grossly intact.  PSYCHIATRIC:  Normal affect, appropriately dressed.  No tangential thoughts.   ADMISSION LABORATORY DATA:  Significant for potassium 3.0, creatinine 1.6.  Her LFTs were normal.  She had white blood cell count 4.4, hemoglobin 6.2  and platelets of 481.  On May 26, 2005, her ESR was 85.  On May 26, 2005, her TSH was 1.51, free T4 was 1.0.  Cortisol was 127.  On  May 27, 2005, she had a retic count percent of 1.2, ferritin of 5.  Erythropoietin 116.  Hemoglobin electrophoresis pending.   HOSPITAL COURSE:  PROBLEM  #1 -  ANEMIA:  On admission, patient was  symptomatic from severe iron deficiency anemia, Hgb 5.0  Retic percent was  low at  1.2.  Hemoglobin electrophoresis was normal.  Haptoglobin was 125.  SPE performed, which revealed nonspecific diffuse polyclonal increase in  gamma globulins, making multiple myeloma unlikely.  Patient's anemia likely  due to multiple causes, including menorrhagia, poor nutrition and SLE.  Patient admitted to stepdown unit because she was actively menstruating and  further blood loss was anticipated.  After transfusion of two units of  packed red blood cells, hemoglobin increased to 7.8.  Hemoglobin remained  stable after initial transfusion for the remainder of her hospitalization.  Menstruation slowed and patient's anemia symptoms were improved on  discharge.  Patient was given a shot of Aranesp prior to discharge because  of low erythropoietin level, given patient's anemia.  Patient was discharged  on ferrous sulfate.   PROBLEM #2 -  SYSTEMIC LUPUS ERYTHEMATOSUS:  Patient stated that twin sister  died of multiorgan failure due to SLE.  Patient worked up for SLE.  ESR  equal to 85.  ANA positive with titer of 1:160, ANA pattern was homologous  with cytoplasmic fluorescence present.  Anti-DNA double stranded was  negative.  Patient has history of occasional pleuritic pain, and renal  insufficiency and was diagnosed during hospitalization, making patient  likely to have SLE per ARA diagnostic criteria.  Further work-up, i.e.,  testing for anti-Smith antibodies in outpatient setting and rheumatological  referral plan.   PROBLEM #3 -  RENAL INSUFFICIENCY:  On admission, patient's creatinine was  elevated at 1.6 with no prior history of renal insufficiency available.  There was no significant improvement after volume resuscitation.  A 24-hour  urine protein collected and revealed nephrotic range proteinuria and decreased creatinie clearance of 70 mL/minute.  Renal  ultrasound showed  slightly large bilateral kidneys, otherwise normal.  Further work-up in  outpatient setting was recommended; i.e., renal biopsy, nephrology referral.   PROBLEM #4 -  HYPOKALEMIA:  On admission, potassium was 2.5.  Possible  causes, food, decreased p.o. intake, increased urinary losses due to  biliuria, salt wasting nephropathy which can occur in setting of lupus with  KCl depletion, potassium improved to 3.8 and was 3.9 on day of discharge.  Magnesium on admission 1.6, repleted with magnesium oxide.   PROBLEM #5 -  MENORRHAGIA:  Patient has history of menorrhagia since  menarche, which improved historically with  oral contraceptives which were  used for two years.  Currently not on birth control and menorrhagia has  resumed. Possible causes include coagulopathy, hypothyroidism, structural  lesions (i.e., hyperplasia, cancer, leiomyoma, cancer, leiomyoma, polyp,  adenomyosis, fibroids).  TSH was 1.51, free T4 1.0 making hypothyroidism  unlikely.  Patient's PT was 14.8, INR 1.1 and PTT equals 30.  Pelvic  ultrasound performed showing no obvious cause.  Counseled patient to start  routine OB/GYN care and to follow up with OB/GYN physician.   DISCHARGE LABORATORY DATA AND VITALS:  Temperature 98.2, blood pressure  106/76, heart rate 75, respiratory rate 22, O2 saturation 100% on room air.   White blood cell count 5.3, hemoglobin 7.4, hematocrit of 23.8, platelets of  353, MCV 68.1.  Sodium 138, potassium 3.9, chloride 112, bicarb 21, BUN 7,  creatinine 1.5 and glucose 96.  Erythropoietin level was 34.1.  UA:  Leukocyte esterase negative; bacteria rare; white blood cell count of 0 to  2, red blood cell count of 3 to 6; squamous cells rare; protein 100, glucose  negative.      Hollace Hayward, M.D.      Duncan Dull, M.D.  Electronically Signed    TE/MEDQ  D:  07/04/2005  T:  07/04/2005  Job:  [id number]

## 2011-03-10 ENCOUNTER — Ambulatory Visit: Payer: Self-pay | Admitting: Vascular Surgery

## 2011-03-17 ENCOUNTER — Ambulatory Visit: Payer: Self-pay | Admitting: Vascular Surgery

## 2011-09-15 ENCOUNTER — Encounter (HOSPITAL_COMMUNITY): Payer: Self-pay | Admitting: *Deleted

## 2011-09-15 ENCOUNTER — Emergency Department (HOSPITAL_COMMUNITY): Payer: Self-pay

## 2011-09-15 ENCOUNTER — Emergency Department (HOSPITAL_COMMUNITY)
Admission: EM | Admit: 2011-09-15 | Discharge: 2011-09-15 | Disposition: A | Discharge status: Home / Self Care | Payer: Self-pay | Attending: Emergency Medicine | Admitting: Emergency Medicine

## 2011-09-15 DIAGNOSIS — J029 Acute pharyngitis, unspecified: Secondary | ICD-10-CM | POA: Insufficient documentation

## 2011-09-15 DIAGNOSIS — R059 Cough, unspecified: Secondary | ICD-10-CM | POA: Insufficient documentation

## 2011-09-15 DIAGNOSIS — J3489 Other specified disorders of nose and nasal sinuses: Secondary | ICD-10-CM | POA: Insufficient documentation

## 2011-09-15 DIAGNOSIS — M329 Systemic lupus erythematosus, unspecified: Secondary | ICD-10-CM | POA: Insufficient documentation

## 2011-09-15 DIAGNOSIS — I10 Essential (primary) hypertension: Secondary | ICD-10-CM | POA: Insufficient documentation

## 2011-09-15 DIAGNOSIS — J019 Acute sinusitis, unspecified: Secondary | ICD-10-CM | POA: Insufficient documentation

## 2011-09-15 DIAGNOSIS — R05 Cough: Secondary | ICD-10-CM | POA: Insufficient documentation

## 2011-09-15 HISTORY — DX: Essential (primary) hypertension: I10

## 2011-09-15 HISTORY — DX: Reserved for concepts with insufficient information to code with codable children: IMO0002

## 2011-09-15 HISTORY — DX: Disorder of kidney and ureter, unspecified: N28.9

## 2011-09-15 HISTORY — DX: Systemic lupus erythematosus, unspecified: M32.9

## 2011-09-15 NOTE — ED Provider Notes (Signed)
History     CSN: [id number]  Arrival date & time 09/15/11  1219   First MD Initiated Contact with Patient 09/15/11 1229      Chief Complaint  Patient presents with  . Cough    (Consider location/radiation/quality/duration/timing/severity/associated sxs/prior treatment) HPI Comments: Patient presents with cold symptoms for the last week.  She's concerned because the over-the-counter medicines or not improving her symptoms.  She's noted nasal congestion and sinus pressure.  She's now got a cough productive of yellow and green sputum.  She has no shortness of breath.  She has no fevers but she does complain of some sore throat.  She's able to swallow and speak without difficulty.  Notably she has not taken her blood pressure medication today.  Patient is a 36 y.o. female presenting with cough. The history is provided by the patient. No language interpreter was used.  Cough This is a new problem. The current episode started more than 2 days ago. The problem occurs every few minutes. The problem has not changed since onset.The cough is productive of purulent sputum. There has been no fever. Associated symptoms include rhinorrhea and sore throat. Pertinent negatives include no chest pain, no chills, no sweats, no ear congestion, no ear pain, no headaches, no myalgias, no shortness of breath, no wheezing and no eye redness. She has tried decongestants for the symptoms. The treatment provided no relief. She is not a smoker. Her past medical history does not include bronchitis, pneumonia, bronchiectasis, COPD, emphysema or asthma.    Past Medical History  Diagnosis Date  . Hypertension   . Lupus   . Renal disorder     Past Surgical History  Procedure Date  . Abdominal hysterectomy     History reviewed. No pertinent family history.  History  Substance Use Topics  . Smoking status: Never Smoker   . Smokeless tobacco: Not on file  . Alcohol Use: No    OB History    Grav Para Term  Preterm Abortions TAB SAB Ect Mult Living                  Review of Systems  Constitutional: Negative.  Negative for fever and chills.  HENT: Positive for congestion, sore throat, rhinorrhea and sinus pressure. Negative for ear pain.   Eyes: Negative.  Negative for discharge and redness.  Respiratory: Positive for cough. Negative for shortness of breath and wheezing.   Cardiovascular: Negative.  Negative for chest pain.  Gastrointestinal: Negative.  Negative for nausea, vomiting, abdominal pain and diarrhea.  Genitourinary: Negative.  Negative for dysuria and vaginal discharge.  Musculoskeletal: Negative.  Negative for myalgias and back pain.  Skin: Negative.  Negative for color change and rash.  Neurological: Negative.  Negative for syncope and headaches.  Hematological: Negative.  Negative for adenopathy.  Psychiatric/Behavioral: Negative.  Negative for confusion.  All other systems reviewed and are negative.    Allergies  Aspirin  Home Medications  No current outpatient prescriptions on file.  BP 176/131  Pulse 115  Temp(Src) 99.1 F (37.3 C) (Oral)  Resp 20  SpO2 96%  Physical Exam  Nursing note and vitals reviewed. Constitutional: She is oriented to person, place, and time. She appears well-developed and well-nourished.  Non-toxic appearance. She does not have a sickly appearance.       Obese female sitting comfortably on the bed.  HENT:  Head: Normocephalic and atraumatic.  Eyes: Conjunctivae, EOM and lids are normal. Pupils are equal, round, and reactive to light.  No scleral icterus.  Neck: Trachea normal and normal range of motion. Neck supple.  Cardiovascular: Regular rhythm and normal heart sounds.  Tachycardia present.   Pulmonary/Chest: Effort normal and breath sounds normal. No respiratory distress. She has no wheezes. She has no rales.  Abdominal: Soft. Normal appearance. There is no tenderness. There is no rebound, no guarding and no CVA tenderness.    Musculoskeletal: Normal range of motion.  Neurological: She is alert and oriented to person, place, and time. She has normal strength.  Skin: Skin is warm, dry and intact. No rash noted.  Psychiatric: She has a normal mood and affect. Her behavior is normal. Judgment and thought content normal.    ED Course  Procedures (including critical care time)  Dg Chest 2 View  09/15/2011  *RADIOLOGY REPORT*  Clinical Data: Cough and shortness of breath, history of lupus and hypertension  CHEST - 2 VIEW  Comparison: August 25, 2010  Findings: The cardiac silhouette, mediastinum, pulmonary vasculature are within normal limits.  Both lungs are clear. There is no acute bony abnormality.  IMPRESSION: There is no evidence of acute cardiac or pulmonary process.  Original Report Authenticated By: Brandon Melnick, M.D.      MDM  Patient with likely viral sinusitis per her own suspicion.  I will advise her to continue using over-the-counter medicines as is likely needs more time to work to further resolve.  She can return for further worsening symptoms.        Nat Christen, MD 09/15/11 [phone]

## 2011-09-15 NOTE — ED Notes (Signed)
Pt is here for a productive cough that is yellow or green.  Pt reports nasal congestion

## 2012-01-19 ENCOUNTER — Encounter: Payer: Self-pay | Admitting: Vascular Surgery

## 2012-01-20 ENCOUNTER — Encounter: Payer: Self-pay | Admitting: Vascular Surgery

## 2012-01-20 ENCOUNTER — Ambulatory Visit (INDEPENDENT_AMBULATORY_CARE_PROVIDER_SITE_OTHER): Payer: Self-pay | Admitting: Vascular Surgery

## 2012-01-20 VITALS — BP 156/108 | HR 77 | Temp 98.5°F | Ht 66.0 in | Wt 299.0 lb

## 2012-01-20 DIAGNOSIS — N186 End stage renal disease: Secondary | ICD-10-CM | POA: Insufficient documentation

## 2012-01-20 HISTORY — DX: End stage renal disease: N18.6

## 2012-01-20 NOTE — Progress Notes (Signed)
[facility] [place]  Established Dialysis Access  History of Present Illness  Cynthia Pugh is a 36 y.o. (1976/03/24) female who presents for re-evaluation for permanent access.  The patient is left hand dominant.  Previous access procedures have been completed in the left arm.  The patient's complication from previous access procedures include: thrombosis.  The patient has never had a previous PPM placed.  Unfortunately, the patient is nearing ESRD and likely will need HD in the next two months.  Past Medical History, Past Surgical History, Social History, Family History, Medications, Allergies, and Review of Systems are unchanged from previous visit on 11/04/10.  Physical Examination  Filed Vitals:   01/20/12 1551  BP: 156/108  Pulse: 77  Temp: 98.5 F (36.9 C)  TempSrc: Oral  Height: 5\' 6"  (1.676 m)  Weight: 299 lb (135.626 kg)   Body mass index is 48.26 kg/(m^2).  General: A&O x 3, WDWN, morbidly obese  Pulmonary: Sym exp, good air movt, CTAB, no rales, rhonchi, & wheezing  Cardiac: RRR, Nl S1, S2, no Murmurs, rubs or gallops  Gastrointestinal: soft, NTND, -G/R, - HSM, - masses, - CVAT B  Musculoskeletal: M/S 5/5 throughout , Extremities without  ischemic changes   Neurologic: Pain and light touch intact in extremities , Motor exam as listed above  Medical Decision Making  Cynthia Pugh is a 36 y.o. female who presents with CKD Stage V  Based on vein mapping and examination, this patient's permanent access options include: possible R BVT vs R UA ABG  I had an extensive discussion with this patient in regards to the nature of access surgery, including risk, benefits, and alternatives.    The patient is aware that the risks of access surgery include but are not limited to: bleeding, infection, steal syndrome, nerve damage, ischemic monomelic neuropathy, failure of access to mature, and possible need for additional access procedures in the  future.  The patient has agreed to proceed with the above procedure which will be scheduled 10 JUN 13.  Leonides Sake, MD [facility] [place] Office: [phone] Pager: [phone]  01/20/2012, 4:10 PM

## 2012-02-01 ENCOUNTER — Other Ambulatory Visit: Payer: Self-pay

## 2012-02-01 ENCOUNTER — Encounter (HOSPITAL_COMMUNITY): Payer: Self-pay | Admitting: Pharmacy Technician

## 2012-02-02 ENCOUNTER — Encounter (HOSPITAL_COMMUNITY)
Admission: RE | Admit: 2012-02-02 | Discharge: 2012-02-02 | Disposition: A | Discharge status: Home / Self Care | Payer: Medicaid Other | Source: Ambulatory Visit | Attending: Vascular Surgery | Admitting: Vascular Surgery

## 2012-02-02 ENCOUNTER — Encounter (HOSPITAL_COMMUNITY): Payer: Self-pay

## 2012-02-02 HISTORY — DX: Anemia, unspecified: D64.9

## 2012-02-02 LAB — SURGICAL PCR SCREEN
MRSA, PCR: NEGATIVE
Staphylococcus aureus: NEGATIVE

## 2012-02-02 NOTE — Pre-Procedure Instructions (Signed)
20 Cynthia Pugh  02/02/2012   Your procedure is scheduled on:  02/06/12  Report to [facility] at 530 AM.  Call this number if you have problems the morning of surgery: [phone]   Remember:   Do not eat food:After Midnight.  May have clear liquids: up to 4 Hours before arrival.  Clear liquids include soda, tea, black coffee, apple or grape juice, broth.  Take these medicines the morning of surgery with A SIP OF WATER: labetalol,cellcept,zemplar,tums   Do not wear jewelry, make-up or nail polish.  Do not wear lotions, powders, or perfumes. You may wear deodorant.  Do not shave 48 hours prior to surgery. Men may shave face and neck.  Do not bring valuables to the hospital.  Contacts, dentures or bridgework may not be worn into surgery.  Leave suitcase in the car. After surgery it may be brought to your room.  For patients admitted to the hospital, checkout time is 11:00 AM the day of discharge.   Patients discharged the day of surgery will not be allowed to drive home.  Name and phone number of your driver: family  Special Instructions: CHG Shower Use Special Wash: 1/2 bottle night before surgery and 1/2 bottle morning of surgery.   Please read over the following fact sheets that you were given: Pain Booklet, Coughing and Deep Breathing, MRSA Information and Surgical Site Infection Prevention

## 2012-02-05 MED ORDER — CEFAZOLIN SODIUM-DEXTROSE 2-3 GM-% IV SOLR
2.0000 g | INTRAVENOUS | Status: DC
  Filled 2012-02-05: qty 50

## 2012-02-06 ENCOUNTER — Encounter (HOSPITAL_COMMUNITY): Payer: Self-pay | Admitting: Anesthesiology

## 2012-02-06 ENCOUNTER — Telehealth: Payer: Self-pay | Admitting: Vascular Surgery

## 2012-02-06 ENCOUNTER — Encounter (HOSPITAL_COMMUNITY)
Admission: RE | Disposition: A | Discharge status: Home / Self Care | Payer: Self-pay | Source: Ambulatory Visit | Attending: Vascular Surgery

## 2012-02-06 ENCOUNTER — Ambulatory Visit (HOSPITAL_COMMUNITY): Payer: Medicaid Other | Admitting: Anesthesiology

## 2012-02-06 ENCOUNTER — Ambulatory Visit (HOSPITAL_COMMUNITY)
Admission: RE | Admit: 2012-02-06 | Discharge: 2012-02-06 | Disposition: A | Discharge status: Home / Self Care | Payer: Medicaid Other | Source: Ambulatory Visit | Attending: Vascular Surgery | Admitting: Vascular Surgery

## 2012-02-06 DIAGNOSIS — N185 Chronic kidney disease, stage 5: Secondary | ICD-10-CM | POA: Insufficient documentation

## 2012-02-06 DIAGNOSIS — D649 Anemia, unspecified: Secondary | ICD-10-CM | POA: Insufficient documentation

## 2012-02-06 DIAGNOSIS — N186 End stage renal disease: Secondary | ICD-10-CM

## 2012-02-06 DIAGNOSIS — I120 Hypertensive chronic kidney disease with stage 5 chronic kidney disease or end stage renal disease: Secondary | ICD-10-CM | POA: Insufficient documentation

## 2012-02-06 DIAGNOSIS — M329 Systemic lupus erythematosus, unspecified: Secondary | ICD-10-CM | POA: Insufficient documentation

## 2012-02-06 DIAGNOSIS — Z01810 Encounter for preprocedural cardiovascular examination: Secondary | ICD-10-CM | POA: Insufficient documentation

## 2012-02-06 DIAGNOSIS — Z951 Presence of aortocoronary bypass graft: Secondary | ICD-10-CM | POA: Insufficient documentation

## 2012-02-06 SURGERY — TRANSPOSITION, VEIN, BASILIC
Anesthesia: General | Site: Arm Upper | Laterality: Right | Wound class: Clean

## 2012-02-06 MED ORDER — OXYCODONE HCL 5 MG PO TABS: 5.0000 mg | ORAL_TABLET | ORAL | Status: AC | PRN

## 2012-02-06 MED ORDER — ONDANSETRON HCL 4 MG/2ML IJ SOLN
4.0000 mg | Freq: Once | INTRAMUSCULAR | Status: AC | PRN
  Administered 2012-02-06: 4 mg via INTRAVENOUS

## 2012-02-06 MED ORDER — LIDOCAINE HCL (CARDIAC) 20 MG/ML IV SOLN
INTRAVENOUS | Status: DC | PRN
  Administered 2012-02-06: 100 mg via INTRAVENOUS

## 2012-02-06 MED ORDER — SODIUM CHLORIDE 0.9 % IV SOLN: INTRAVENOUS | Status: DC

## 2012-02-06 MED ORDER — ONDANSETRON HCL 4 MG/2ML IJ SOLN
INTRAMUSCULAR | Status: AC
  Filled 2012-02-06: qty 2

## 2012-02-06 MED ORDER — SODIUM CHLORIDE 0.9 % IV SOLN
INTRAVENOUS | Status: DC | PRN
  Administered 2012-02-06: 07:00:00 via INTRAVENOUS

## 2012-02-06 MED ORDER — SODIUM CHLORIDE 0.9 % IR SOLN
Status: DC | PRN
  Administered 2012-02-06: 08:00:00

## 2012-02-06 MED ORDER — 0.9 % SODIUM CHLORIDE (POUR BTL) OPTIME
TOPICAL | Status: DC | PRN
  Administered 2012-02-06: 1000 mL

## 2012-02-06 MED ORDER — PHENYLEPHRINE HCL 10 MG/ML IJ SOLN
INTRAMUSCULAR | Status: DC | PRN
  Administered 2012-02-06: 80 ug via INTRAVENOUS
  Administered 2012-02-06: 40 ug via INTRAVENOUS
  Administered 2012-02-06 (×3): 80 ug via INTRAVENOUS

## 2012-02-06 MED ORDER — ONDANSETRON HCL 4 MG/2ML IJ SOLN
INTRAMUSCULAR | Status: DC | PRN
  Administered 2012-02-06: 4 mg via INTRAVENOUS

## 2012-02-06 MED ORDER — PROPOFOL 10 MG/ML IV EMUL
INTRAVENOUS | Status: DC | PRN
  Administered 2012-02-06: 200 mg via INTRAVENOUS

## 2012-02-06 MED ORDER — FENTANYL CITRATE 0.05 MG/ML IJ SOLN
INTRAMUSCULAR | Status: DC | PRN
  Administered 2012-02-06 (×2): 25 ug via INTRAVENOUS
  Administered 2012-02-06: 50 ug via INTRAVENOUS
  Administered 2012-02-06: 25 ug via INTRAVENOUS
  Administered 2012-02-06 (×2): 50 ug via INTRAVENOUS

## 2012-02-06 MED ORDER — HYDROMORPHONE HCL PF 1 MG/ML IJ SOLN
INTRAMUSCULAR | Status: AC
  Filled 2012-02-06: qty 1

## 2012-02-06 MED ORDER — THROMBIN 20000 UNITS EX KIT
PACK | CUTANEOUS | Status: DC | PRN
  Administered 2012-02-06: 09:00:00 via TOPICAL

## 2012-02-06 MED ORDER — HYDROMORPHONE HCL PF 1 MG/ML IJ SOLN
0.2500 mg | INTRAMUSCULAR | Status: DC | PRN
  Administered 2012-02-06 (×2): 0.5 mg via INTRAVENOUS

## 2012-02-06 SURGICAL SUPPLY — 44 items
BENZOIN TINCTURE PRP APPL 2/3 (GAUZE/BANDAGES/DRESSINGS) ×2 IMPLANT
CANISTER SUCTION 2500CC (MISCELLANEOUS) ×2 IMPLANT
CLIP TI MEDIUM 6 (CLIP) ×2 IMPLANT
CLIP TI WIDE RED SMALL 6 (CLIP) ×2 IMPLANT
CLOTH BEACON ORANGE TIMEOUT ST (SAFETY) ×2 IMPLANT
CLSR STERI-STRIP ANTIMIC 1/2X4 (GAUZE/BANDAGES/DRESSINGS) ×2 IMPLANT
COVER PROBE W GEL 5X96 (DRAPES) ×2 IMPLANT
COVER SURGICAL LIGHT HANDLE (MISCELLANEOUS) ×4 IMPLANT
DECANTER SPIKE VIAL GLASS SM (MISCELLANEOUS) ×2 IMPLANT
DERMABOND ADVANCED (GAUZE/BANDAGES/DRESSINGS)
DERMABOND ADVANCED .7 DNX12 (GAUZE/BANDAGES/DRESSINGS) IMPLANT
DRAIN PENROSE 1/2X12 LTX STRL (WOUND CARE) IMPLANT
ELECT REM PT RETURN 9FT ADLT (ELECTROSURGICAL) ×2
ELECTRODE REM PT RTRN 9FT ADLT (ELECTROSURGICAL) ×1 IMPLANT
GLOVE BIO SURGEON STRL SZ7 (GLOVE) ×2 IMPLANT
GLOVE BIOGEL M 6.5 STRL (GLOVE) ×2 IMPLANT
GLOVE BIOGEL PI IND STRL 6.5 (GLOVE) ×2 IMPLANT
GLOVE BIOGEL PI IND STRL 7.0 (GLOVE) ×1 IMPLANT
GLOVE BIOGEL PI IND STRL 7.5 (GLOVE) ×3 IMPLANT
GLOVE BIOGEL PI INDICATOR 6.5 (GLOVE) ×2
GLOVE BIOGEL PI INDICATOR 7.0 (GLOVE) ×1
GLOVE BIOGEL PI INDICATOR 7.5 (GLOVE) ×3
GLOVE ECLIPSE 6.5 STRL STRAW (GLOVE) ×2 IMPLANT
GLOVE SURG SS PI 7.0 STRL IVOR (GLOVE) ×2 IMPLANT
GLOVE SURG SS PI 7.5 STRL IVOR (GLOVE) ×4 IMPLANT
GOWN STRL NON-REIN LRG LVL3 (GOWN DISPOSABLE) ×8 IMPLANT
KIT BASIN OR (CUSTOM PROCEDURE TRAY) ×2 IMPLANT
KIT ROOM TURNOVER OR (KITS) ×2 IMPLANT
NS IRRIG 1000ML POUR BTL (IV SOLUTION) ×2 IMPLANT
PACK CV ACCESS (CUSTOM PROCEDURE TRAY) ×2 IMPLANT
PAD ARMBOARD 7.5X6 YLW CONV (MISCELLANEOUS) ×4 IMPLANT
SPONGE GAUZE 4X4 12PLY (GAUZE/BANDAGES/DRESSINGS) ×2 IMPLANT
SPONGE SURGIFOAM ABS GEL 100 (HEMOSTASIS) ×2 IMPLANT
SUT MNCRL AB 4-0 PS2 18 (SUTURE) ×2 IMPLANT
SUT PROLENE 6 0 BV (SUTURE) ×2 IMPLANT
SUT PROLENE 7 0 BV 1 (SUTURE) ×2 IMPLANT
SUT SILK 2 0 SH (SUTURE) IMPLANT
SUT VIC AB 3-0 SH 27 (SUTURE) ×1
SUT VIC AB 3-0 SH 27X BRD (SUTURE) ×1 IMPLANT
TAPE CLOTH SURG 4X10 WHT LF (GAUZE/BANDAGES/DRESSINGS) ×2 IMPLANT
TOWEL OR 17X24 6PK STRL BLUE (TOWEL DISPOSABLE) ×2 IMPLANT
TOWEL OR 17X26 10 PK STRL BLUE (TOWEL DISPOSABLE) ×2 IMPLANT
UNDERPAD 30X30 INCONTINENT (UNDERPADS AND DIAPERS) ×2 IMPLANT
WATER STERILE IRR 1000ML POUR (IV SOLUTION) ×2 IMPLANT

## 2012-02-06 NOTE — Anesthesia Procedure Notes (Signed)
Procedure Name: LMA Insertion Date/Time: 02/06/2012 7:38 AM Performed by: Sherie Don Pre-anesthesia Checklist: Patient identified, Emergency Drugs available, Suction available, Patient being monitored and Timeout performed Patient Re-evaluated:Patient Re-evaluated prior to inductionOxygen Delivery Method: Circle system utilized Preoxygenation: Pre-oxygenation with 100% oxygen Intubation Type: IV induction LMA: LMA inserted and LMA with gastric port inserted LMA Size: 4.0 Number of attempts: 1 Tube secured with: Tape Dental Injury: Teeth and Oropharynx as per pre-operative assessment

## 2012-02-06 NOTE — Discharge Instructions (Addendum)
Instructions Following General Anesthetic, Adult A nurse specialized in giving anesthesia (anesthetist) or a doctor specialized in giving anesthesia (anesthesiologist) gave you a medicine that made you sleep while a procedure was performed. For as long as 24 hours following this procedure, you may feel:  Dizzy.   Weak.   Drowsy.  AFTER THE PROCEDURE After surgery, you will be taken to the recovery area where a nurse will monitor your progress. You will be allowed to go home when you are awake, stable, taking fluids well, and without complications. For the first 24 hours following an anesthetic:  Have a responsible person with you.   Do not drive a car. If you are alone, do not take public transportation.   Do not drink alcohol.   Do not take medicine that has not been prescribed by your caregiver.   Do not sign important papers or make important decisions.   You may resume normal diet and activities as directed.   Change bandages (dressings) as directed.   Only take over-the-counter or prescription medicines for pain, discomfort, or fever as directed by your caregiver.  If you have questions or problems that seem related to the anesthetic, call the hospital and ask for the anesthetist or anesthesiologist on call. SEEK IMMEDIATE MEDICAL CARE IF:   You develop a rash.   You have difficulty breathing.   You have chest pain.   You develop any allergic problems.  Document Released: 11/21/2000 Document Revised: 08/04/2011 Document Reviewed: 07/02/2007 Exit[employer] Patient Information 2012 [employer]. 

## 2012-02-06 NOTE — Transfer of Care (Signed)
Immediate Anesthesia Transfer of Care Note  Patient: Cynthia Pugh  Procedure(s) Performed: Procedure(s) (LRB): BASCILIC VEIN TRANSPOSITION (Right)  Patient Location: PACU  Anesthesia Type: General  Level of Consciousness: awake  Airway & Oxygen Therapy: Patient Spontanous Breathing and Patient connected to nasal cannula oxygen  Post-op Assessment: Report given to PACU RN, Post -op Vital signs reviewed and stable and Patient moving all extremities X 4  Post vital signs: Reviewed and stable  Complications: No apparent anesthesia complications

## 2012-02-06 NOTE — Op Note (Signed)
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  Early arterial bifurcation: radial artery ~2.5 mm 2.  Cephalic vein: ~3 mm 3.  Basilic vein: ~2.5 mm  SPECIMEN(S):  none  INDICATIONS:   Cynthia Pugh is a 36 y.o. female who presents with chronic kidney disease stage IV-V .  The patient is scheduled for right staged basilic vein transposition vs. arteriovenous graft placement.  The patient is aware the risks include but are not limited to: bleeding, infection, steal syndrome, nerve damage, ischemic monomelic neuropathy, failure to mature, and need for additional procedures.  The patient is aware of the risks of the procedure and elects to proceed forward.  DESCRIPTION: After full informed written consent was obtained from the patient, the patient was brought back to the operating room and placed supine upon the operating table.  Prior to induction, the patient received IV antibiotics.   After obtaining adequate anesthesia, the patient was then prepped and draped in the standard fashion for a right arm access procedure.  I turned my attention first to identifying the patient's basilic vein and brachial artery.  Using SonoSite guidance, the location of these vessels were marked out on the skin.   There appeared to be an early bifurcation in the brachial artery and the basilic vein looked to be only 2.5 mm.  I checked the upper arm vein to see if it might be usable and it appeared to be 3 mm on Sonosite.  Subsequently, I felt an attempted at a brachiocephalic arteriovenous fistula should be made.  I made a transverse incision at the level of the antecubitum and dissected through the subcutaneous tissue and fascia to gain  exposure of the brachial artery.  It became evident that the radial and ulnar arteries had already bifurcated at this level of dissection.  The ulnar artery was ~2.5 mm in diameter and the radial artery was 3 mm in diameter externally.  The radial artery was dissected out proximally and distally and controlled with vessel loops .  I then dissected out the cephalic vein.  This was noted to be 3 mm in diameter externally.  There was significant amount of sclerotic tissue surrounding the vein.  The distal segment of the vein was ligated with a  2-0 silk, and the vein was transected.  The proximal segment was iinterrogated with serial dilators.  The vein accepted up to a 3 mm dilator without any difficulty.  I then instilled the heparinized saline into the vein and clamped it.  At this point, I reset my exposure of the radial artery and placed the artery under tension proximally and distally.  I made an arteriotomy with a #11 blade, and then I extended the arteriotomy with a Potts scissor.  I injected heparinized saline proximal and distal to this arteriotomy.  The vein was then sewn to the artery in an end-to-side configuration with a running stitch of 7-0 Prolene.  Prior to completing this anastomosis, I allowed the vein and artery to backbleed.  There was no evidence of clot from any vessels.  I completed the anastomosis in the usual fashion and then released all vessel loops and clamps.  There was a palpable  thrill in the venous outflow, and there was a dopplerable radial pulse.  There remained a strong ulnar signal.  At this point, I irrigated out the surgical wound.  There was no further active bleeding.  The subcutaneous tissue was reapproximated with a running stitch of 3-0 Vicryl.  The skin was then reapproximated with a running subcuticular stitch of 4-0 Vicryl.  The skin was then cleaned, dried, and reinforced with Dermabond.  The patient tolerated this procedure well.   COMPLICATIONS: none  CONDITION:  stable  Leonides Sake, MD [facility] [place] Office: [phone] Pager: [phone]  02/06/2012, 9:33 AM

## 2012-02-06 NOTE — Anesthesia Preprocedure Evaluation (Addendum)
Anesthesia Evaluation  Patient identified by MRN, date of birth, ID band Patient awake    Reviewed: Allergy & Precautions, H&P , NPO status , Patient's Chart, lab work & pertinent test results  Airway Mallampati: I TM Distance: >3 FB Neck ROM: full    Dental   Pulmonary          Cardiovascular hypertension, Pt. on home beta blockers Rhythm:regular Rate:Normal     Neuro/Psych    GI/Hepatic   Endo/Other    Renal/GU Dialysis, ESRF and CRFRenal disease     Musculoskeletal   Abdominal   Peds  Hematology   Anesthesia Other Findings   Reproductive/Obstetrics                          Anesthesia Physical Anesthesia Plan  ASA: III  Anesthesia Plan: General   Post-op Pain Management:    Induction: Intravenous  Airway Management Planned: LMA and Oral ETT  Additional Equipment:   Intra-op Plan:   Post-operative Plan: Extubation in OR  Informed Consent: I have reviewed the patients History and Physical, chart, labs and discussed the procedure including the risks, benefits and alternatives for the proposed anesthesia with the patient or authorized representative who has indicated his/her understanding and acceptance.     Plan Discussed with: CRNA, Anesthesiologist and Surgeon  Anesthesia Plan Comments:         Anesthesia Quick Evaluation

## 2012-02-06 NOTE — Preoperative (Signed)
Beta Blockers   Reason not to administer Beta Blockers:Labetalol this am

## 2012-02-06 NOTE — Telephone Encounter (Addendum)
Message copied by Shari Prows on Mon Feb 06, 2012 10:27 AM ------      Message from: Melene Plan      Created: Mon Feb 06, 2012 10:00 AM                   ----- Message -----         From: Fransisco Hertz, MD         Sent: 02/06/2012   9:49 AM           To: Reuel Derby, Melene Plan, RN            Cynthia Pugh      [id number]      June 23, 1976            Procedure: R proximal RC AVF            Asst: Della Goo, [facility]            Follow-up: 4 wk            I scheduled an appt for this pt w/ [facility] on Friday 03/16/12 at 11am. I mailed an appt letter to pt and also left voicemail message regarding this appt. Jacklyn Shell

## 2012-02-06 NOTE — H&P (Signed)
  [facility]   Brief History and Physical  History of Present Illness  Cynthia Pugh is a 36 y.o. female who presents with chief complaint: needed for access.  The patient presents today for R BVT vs AVG.    Past Medical History  Diagnosis Date  . Hypertension   . Lupus   . Renal disorder   . Anemia     Past Surgical History  Procedure Date  . Abdominal hysterectomy   . Av fistula placement 08/25/2010  . Dilation and curettage of uterus     x2    History   Social History  . Marital Status: Single    Spouse Name: N/A    Number of Children: N/A  . Years of Education: N/A   Occupational History  . Not on file.   Social History Main Topics  . Smoking status: Never Smoker   . Smokeless tobacco: Never Used  . Alcohol Use: No  . Drug Use: No  . Sexually Active:    Other Topics Concern  . Not on file   Social History Narrative  . No narrative on file    Family History  Problem Relation Age of Onset  . Adopted: Yes    No current facility-administered medications on file prior to encounter.   Current Outpatient Prescriptions on File Prior to Encounter  Medication Sig Dispense Refill  . calcium carbonate (TUMS - DOSED IN MG ELEMENTAL CALCIUM) 500 MG chewable tablet Chew 4 tablets by mouth 4 (four) times daily. 4 tablets after every meal then 4 tablets at bedtime      . furosemide (LASIX) 80 MG tablet Take 80 mg by mouth 4 (four) times daily.      Marland Kitchen labetalol (NORMODYNE) 100 MG tablet Take 100 mg by mouth every morning.      . mycophenolate (CELLCEPT) 250 MG capsule Take 750 mg by mouth 2 (two) times daily.      . paricalcitol (ZEMPLAR) 1 MCG capsule Take 1 mcg by mouth daily.        Allergies  Allergen Reactions  . Aspirin Other (See Comments)    unknown    Review of Systems: As listed above, otherwise negative.  Physical Examination  Filed Vitals:   02/06/12 0602  BP: 159/109  Pulse: 82  Temp: 98.5 F (36.9 C)    TempSrc: Oral  Resp: 20  SpO2: 97%    General: A&O x 3, WDWN  Pulmonary: Sym exp, good air movt, CTAB, no rales, rhonchi, & wheezing  Cardiac: RRR, Nl S1, S2, no Murmurs, rubs or gallops  Gastrointestinal: soft, NTND, -G/R, - HSM, - masses, - CVAT B  Musculoskeletal: M/S 5/5 throughout , Extremities without ischemic changes   Laboratory See iStat  Medical Decision Making  Cynthia Pugh is a 36 y.o. female who presents with: need for permanent access for hemodialyssis.   The patient is scheduled for: R BVT vs AVG placement  Risk, benefits, and alternatives to access surgery were discussed.  The patient is aware the risks include but are not limited to: bleeding, infection, steal syndrome, nerve damage, ischemic monomelic neuropathy, failure to mature, and need for additional procedures.  The patient is aware of the risks and agrees to proceed.  Leonides Sake, MD [facility] [place] Office: [phone] Pager: [phone]  02/06/2012, 7:26 AM

## 2012-02-06 NOTE — Anesthesia Postprocedure Evaluation (Signed)
  Anesthesia Post-op Note  Patient: Cynthia Pugh  Procedure(s) Performed: Procedure(s) (LRB): BASCILIC VEIN TRANSPOSITION (Right)  Patient Location: PACU  Anesthesia Type: General  Level of Consciousness: awake, alert , oriented and patient cooperative  Airway and Oxygen Therapy: Patient Spontanous Breathing and Patient connected to nasal cannula oxygen  Post-op Pain: mild  Post-op Assessment: Post-op Vital signs reviewed, Patient's Cardiovascular Status Stable, Respiratory Function Stable, Patent Airway, No signs of Nausea or vomiting and Pain level controlled  Post-op Vital Signs: stable  Complications: No apparent anesthesia complications

## 2012-02-07 LAB — POCT I-STAT 4, (NA,K, GLUC, HGB,HCT)
Glucose, Bld: 86 mg/dL (ref 70–99)
HCT: 30 % — ABNORMAL LOW (ref 36.0–46.0)
Hemoglobin: 10.2 g/dL — ABNORMAL LOW (ref 12.0–15.0)
Potassium: 4 mEq/L (ref 3.5–5.1)

## 2012-03-15 ENCOUNTER — Encounter: Payer: Self-pay | Admitting: Vascular Surgery

## 2012-03-16 ENCOUNTER — Encounter: Payer: Self-pay | Admitting: Vascular Surgery

## 2012-03-16 ENCOUNTER — Ambulatory Visit (INDEPENDENT_AMBULATORY_CARE_PROVIDER_SITE_OTHER): Payer: Self-pay | Admitting: Vascular Surgery

## 2012-03-16 VITALS — BP 138/100 | HR 82 | Temp 98.3°F | Ht 66.0 in | Wt 290.0 lb

## 2012-03-16 DIAGNOSIS — N186 End stage renal disease: Secondary | ICD-10-CM

## 2012-03-16 DIAGNOSIS — Z4931 Encounter for adequacy testing for hemodialysis: Secondary | ICD-10-CM

## 2012-03-16 NOTE — Progress Notes (Signed)
[facility] [place]  Postoperative Access Visit  History of Present Illness  Cynthia Pugh is a 36 y.o. year old female who presents for postoperative follow-up for: R proximal RC AVF (Date: 02/06/12).  The patient's wounds are healed.  The patient notes no steal symptoms.  The patient is able to complete their activities of daily living.  The patient's current symptoms are: none.  Physical Examination  Filed Vitals:   03/16/12 1117  BP: 138/100  Pulse: 82  Temp: 98.3 F (36.8 C)   RUE: Incision is healed, skin feels warm, hand grip is 5/5, sensation in digits is intact, palpable thrill, bruit can be auscultated , On Sonosite: fistula is 5-7 mm in diameter with side branches evident, >1 cm deep at multiple locations  Medical Decision Making  Cynthia Pugh is a 36 y.o. year old female who presents s/p R proximal RC AVF placement.  Formal access duplex will be completed in one month.  If the access is too deep, she may need superficialization and side branch ligation.  Thank you for allowing [place] to participate in this patient's care.  Leonides Sake, MD [facility] [place] Office: [phone] Pager: [phone]

## 2012-03-16 NOTE — Addendum Note (Signed)
Addended by: Sharee Pimple on: 03/16/2012 12:52 PM   Modules accepted: Orders

## 2012-04-19 ENCOUNTER — Encounter: Payer: Self-pay | Admitting: Vascular Surgery

## 2012-04-20 ENCOUNTER — Encounter: Payer: Self-pay | Admitting: Vascular Surgery

## 2012-04-20 ENCOUNTER — Ambulatory Visit (INDEPENDENT_AMBULATORY_CARE_PROVIDER_SITE_OTHER): Payer: Self-pay | Admitting: Vascular Surgery

## 2012-04-20 VITALS — BP 173/118 | HR 78 | Resp 16 | Ht 66.0 in | Wt 290.0 lb

## 2012-04-20 DIAGNOSIS — N186 End stage renal disease: Secondary | ICD-10-CM

## 2012-04-20 DIAGNOSIS — Z4931 Encounter for adequacy testing for hemodialysis: Secondary | ICD-10-CM

## 2012-04-20 NOTE — Progress Notes (Signed)
[facility] [place]  Postoperative Access Visit   History of Present Illness  Cynthia Pugh is a 36 y.o. year old female who presents for postoperative follow-up for: R proximal RC AVF (Date: 02/06/12). The patient's wounds are healed. The patient notes no steal symptoms. The patient is able to complete their activities of daily living. The patient's current symptoms are: none. She returns for R arm access duplex.  The patient is still not on HD  Physical Examination  Filed Vitals:   04/20/12 1138  BP: 173/118  Pulse: 78  Resp: 16  Height: 5\' 6"  (1.676 m)  Weight: 290 lb (131.543 kg)  SpO2: 100%   RUE: Incision is healed, skin feels warm, hand grip is 5/5, sensation in digits is intact, palpable thrill even into upper arm, bruit can be auscultated   R arm access duplex (Date: 04/20/12)  Diameter: 5.5-8.2 mm, depth: 3.5-12.0 mm  Medical Decision Making  Cynthia Pugh is a 36 y.o. year old female who presents s/p R proximal RC AVF placement.  The access duplex demonstrates the proximal vein may be a bit deep.  However, on exam, I can still easily feel the thrill up into the upper arm. I would go ahead a try to cannulate this fistula as needed.  If there are difficulties with the proximal cannulation, at that point I would proceed with superficialization of the fistula.  . There is an area of stenosis by velocity by duplex, but I don't see a corresponding area on tranverse duplex interrogation, so it may represent a area of tortuosity rather than stenosis She can follow up with [place] as needed.  Leonides Sake, MD  [facility] [place]  Office: [phone]  Pager: [phone]

## 2012-04-20 NOTE — Progress Notes (Signed)
Left arteriovenous fistula duplex performed @ [facility] 04/20/2012

## 2012-05-22 ENCOUNTER — Encounter (HOSPITAL_COMMUNITY): Payer: Self-pay | Admitting: *Deleted

## 2012-05-22 ENCOUNTER — Other Ambulatory Visit: Payer: Self-pay | Admitting: *Deleted

## 2012-05-23 ENCOUNTER — Ambulatory Visit (HOSPITAL_COMMUNITY): Payer: Medicaid Other

## 2012-05-23 ENCOUNTER — Encounter (HOSPITAL_COMMUNITY): Payer: Self-pay | Admitting: Anesthesiology

## 2012-05-23 ENCOUNTER — Encounter (HOSPITAL_COMMUNITY): Payer: Self-pay | Admitting: *Deleted

## 2012-05-23 ENCOUNTER — Encounter (HOSPITAL_COMMUNITY)
Admission: RE | Disposition: A | Discharge status: Home / Self Care | Payer: Self-pay | Source: Ambulatory Visit | Attending: Vascular Surgery

## 2012-05-23 ENCOUNTER — Ambulatory Visit (HOSPITAL_COMMUNITY)
Admission: RE | Admit: 2012-05-23 | Discharge: 2012-05-23 | Disposition: A | Discharge status: Home / Self Care | Payer: Medicaid Other | Source: Ambulatory Visit | Attending: Vascular Surgery | Admitting: Vascular Surgery

## 2012-05-23 ENCOUNTER — Ambulatory Visit (HOSPITAL_COMMUNITY): Payer: Medicaid Other | Admitting: Anesthesiology

## 2012-05-23 DIAGNOSIS — I120 Hypertensive chronic kidney disease with stage 5 chronic kidney disease or end stage renal disease: Secondary | ICD-10-CM | POA: Insufficient documentation

## 2012-05-23 DIAGNOSIS — M329 Systemic lupus erythematosus, unspecified: Secondary | ICD-10-CM | POA: Insufficient documentation

## 2012-05-23 DIAGNOSIS — Z4901 Encounter for fitting and adjustment of extracorporeal dialysis catheter: Secondary | ICD-10-CM | POA: Insufficient documentation

## 2012-05-23 DIAGNOSIS — N186 End stage renal disease: Secondary | ICD-10-CM

## 2012-05-23 HISTORY — PX: INSERTION OF DIALYSIS CATHETER: SHX1324

## 2012-05-23 LAB — SURGICAL PCR SCREEN
MRSA, PCR: NEGATIVE
Staphylococcus aureus: NEGATIVE

## 2012-05-23 LAB — POCT I-STAT 4, (NA,K, GLUC, HGB,HCT)
Glucose, Bld: 84 mg/dL (ref 70–99)
Hemoglobin: 9.5 g/dL — ABNORMAL LOW (ref 12.0–15.0)
Potassium: 4.6 mEq/L (ref 3.5–5.1)
Sodium: 142 mEq/L (ref 135–145)

## 2012-05-23 LAB — POCT I-STAT, CHEM 8
BUN: 41 mg/dL — ABNORMAL HIGH (ref 6–23)
Calcium, Ion: 0.8 mmol/L — ABNORMAL LOW (ref 1.12–1.23)
Creatinine, Ser: 7.9 mg/dL — ABNORMAL HIGH (ref 0.50–1.10)
Hemoglobin: 9.5 g/dL — ABNORMAL LOW (ref 12.0–15.0)
TCO2: 17 mmol/L (ref 0–100)

## 2012-05-23 LAB — PROTIME-INR: INR: 1.17 (ref 0.00–1.49)

## 2012-05-23 SURGERY — INSERTION OF DIALYSIS CATHETER
Anesthesia: General | Site: Chest | Laterality: Right | Wound class: Clean

## 2012-05-23 MED ORDER — DEXTROSE 5 % IV SOLN
INTRAVENOUS | Status: DC | PRN
  Administered 2012-05-23: 10:00:00 via INTRAVENOUS

## 2012-05-23 MED ORDER — FENTANYL CITRATE 0.05 MG/ML IJ SOLN: 50.0000 ug | Freq: Once | INTRAMUSCULAR | Status: DC

## 2012-05-23 MED ORDER — LIDOCAINE-EPINEPHRINE (PF) 1 %-1:200000 IJ SOLN: INTRAMUSCULAR | Status: DC | PRN

## 2012-05-23 MED ORDER — SODIUM CHLORIDE 0.9 % IR SOLN
Status: DC | PRN
  Administered 2012-05-23: 10:00:00

## 2012-05-23 MED ORDER — FENTANYL CITRATE 0.05 MG/ML IJ SOLN
25.0000 ug | INTRAMUSCULAR | Status: DC | PRN
  Administered 2012-05-23: 25 ug via INTRAVENOUS

## 2012-05-23 MED ORDER — METOCLOPRAMIDE HCL 5 MG/ML IJ SOLN
INTRAMUSCULAR | Status: DC | PRN
  Administered 2012-05-23: 10 mg via INTRAVENOUS

## 2012-05-23 MED ORDER — HEPARIN SODIUM (PORCINE) 1000 UNIT/ML IJ SOLN
INTRAMUSCULAR | Status: AC
  Filled 2012-05-23: qty 1

## 2012-05-23 MED ORDER — MIDAZOLAM HCL 2 MG/2ML IJ SOLN: 1.0000 mg | INTRAMUSCULAR | Status: DC | PRN

## 2012-05-23 MED ORDER — PROPOFOL 10 MG/ML IV BOLUS
INTRAVENOUS | Status: DC | PRN
  Administered 2012-05-23: 200 mg via INTRAVENOUS

## 2012-05-23 MED ORDER — SODIUM CHLORIDE 0.9 % IV SOLN: INTRAVENOUS | Status: DC

## 2012-05-23 MED ORDER — LIDOCAINE HCL (CARDIAC) 20 MG/ML IV SOLN
INTRAVENOUS | Status: DC | PRN
  Administered 2012-05-23: 80 mg via INTRAVENOUS

## 2012-05-23 MED ORDER — ONDANSETRON HCL 4 MG/2ML IJ SOLN
INTRAMUSCULAR | Status: DC | PRN
  Administered 2012-05-23: 4 mg via INTRAVENOUS

## 2012-05-23 MED ORDER — MIDAZOLAM HCL 5 MG/5ML IJ SOLN
INTRAMUSCULAR | Status: DC | PRN
  Administered 2012-05-23: 2 mg via INTRAVENOUS

## 2012-05-23 MED ORDER — PROMETHAZINE HCL 25 MG/ML IJ SOLN
12.5000 mg | Freq: Once | INTRAMUSCULAR | Status: AC
  Administered 2012-05-23: 12.5 mg via INTRAVENOUS

## 2012-05-23 MED ORDER — HEPARIN SODIUM (PORCINE) 1000 UNIT/ML IJ SOLN
INTRAMUSCULAR | Status: DC | PRN
  Administered 2012-05-23: 4.6 mL via INTRAVENOUS

## 2012-05-23 MED ORDER — FENTANYL CITRATE 0.05 MG/ML IJ SOLN
INTRAMUSCULAR | Status: AC
  Filled 2012-05-23: qty 2

## 2012-05-23 MED ORDER — MUPIROCIN 2 % EX OINT
TOPICAL_OINTMENT | CUTANEOUS | Status: AC
  Administered 2012-05-23: 1 via NASAL
  Filled 2012-05-23: qty 22

## 2012-05-23 MED ORDER — SODIUM CHLORIDE 0.9 % IR SOLN
Status: DC | PRN
  Administered 2012-05-23: 1000 mL

## 2012-05-23 MED ORDER — DEXTROSE 5 % IV SOLN
1.5000 g | INTRAVENOUS | Status: AC
  Administered 2012-05-23: 1.5 g via INTRAVENOUS
  Filled 2012-05-23: qty 1.5

## 2012-05-23 MED ORDER — OXYCODONE HCL 5 MG PO TABS: 5.0000 mg | ORAL_TABLET | Freq: Once | ORAL | Status: DC | PRN

## 2012-05-23 MED ORDER — SODIUM CHLORIDE 0.9 % IV SOLN
INTRAVENOUS | Status: DC | PRN
  Administered 2012-05-23: 09:00:00 via INTRAVENOUS

## 2012-05-23 MED ORDER — DEXAMETHASONE SODIUM PHOSPHATE 4 MG/ML IJ SOLN
INTRAMUSCULAR | Status: DC | PRN
  Administered 2012-05-23: 8 mg via INTRAVENOUS

## 2012-05-23 MED ORDER — OXYCODONE HCL 5 MG/5ML PO SOLN: 5.0000 mg | Freq: Once | ORAL | Status: DC | PRN

## 2012-05-23 MED ORDER — PROMETHAZINE HCL 25 MG/ML IJ SOLN
INTRAMUSCULAR | Status: AC
  Filled 2012-05-23: qty 1

## 2012-05-23 MED ORDER — OXYCODONE HCL 5 MG PO TABS: 5.0000 mg | ORAL_TABLET | ORAL | Status: DC | PRN

## 2012-05-23 MED ORDER — MUPIROCIN 2 % EX OINT
TOPICAL_OINTMENT | Freq: Two times a day (BID) | CUTANEOUS | Status: DC
  Administered 2012-05-23: 1 via NASAL

## 2012-05-23 MED ORDER — FENTANYL CITRATE 0.05 MG/ML IJ SOLN
INTRAMUSCULAR | Status: DC | PRN
  Administered 2012-05-23 (×2): 100 ug via INTRAVENOUS
  Administered 2012-05-23: 50 ug via INTRAVENOUS

## 2012-05-23 MED ORDER — LIDOCAINE-EPINEPHRINE (PF) 1 %-1:200000 IJ SOLN
INTRAMUSCULAR | Status: AC
  Filled 2012-05-23: qty 10

## 2012-05-23 MED ORDER — MUPIROCIN 2 % EX OINT
TOPICAL_OINTMENT | CUTANEOUS | Status: AC
  Filled 2012-05-23: qty 22

## 2012-05-23 SURGICAL SUPPLY — 44 items
BAG DECANTER FOR FLEXI CONT (MISCELLANEOUS) ×2 IMPLANT
CATH CANNON HEMO 15F 50CM (CATHETERS) IMPLANT
CATH CANNON HEMO 15FR 19 (HEMODIALYSIS SUPPLIES) IMPLANT
CATH CANNON HEMO 15FR 23CM (HEMODIALYSIS SUPPLIES) ×2 IMPLANT
CATH CANNON HEMO 15FR 31CM (HEMODIALYSIS SUPPLIES) IMPLANT
CATH CANNON HEMO 15FR 32CM (HEMODIALYSIS SUPPLIES) IMPLANT
CLOTH BEACON ORANGE TIMEOUT ST (SAFETY) ×2 IMPLANT
COVER PROBE W GEL 5X96 (DRAPES) ×2 IMPLANT
COVER SURGICAL LIGHT HANDLE (MISCELLANEOUS) ×2 IMPLANT
DERMABOND ADVANCED (GAUZE/BANDAGES/DRESSINGS) ×1
DERMABOND ADVANCED .7 DNX12 (GAUZE/BANDAGES/DRESSINGS) ×1 IMPLANT
DRAPE C-ARM 42X72 X-RAY (DRAPES) ×2 IMPLANT
DRAPE CHEST BREAST 15X10 FENES (DRAPES) ×2 IMPLANT
DRSG TEGADERM 4X4.75 (GAUZE/BANDAGES/DRESSINGS) ×2 IMPLANT
GAUZE SPONGE 2X2 8PLY STRL LF (GAUZE/BANDAGES/DRESSINGS) ×1 IMPLANT
GAUZE SPONGE 4X4 16PLY XRAY LF (GAUZE/BANDAGES/DRESSINGS) ×2 IMPLANT
GLOVE BIO SURGEON STRL SZ7 (GLOVE) ×2 IMPLANT
GLOVE BIOGEL PI IND STRL 7.0 (GLOVE) ×1 IMPLANT
GLOVE BIOGEL PI IND STRL 7.5 (GLOVE) ×1 IMPLANT
GLOVE BIOGEL PI INDICATOR 7.0 (GLOVE) ×1
GLOVE BIOGEL PI INDICATOR 7.5 (GLOVE) ×1
GLOVE SURG SS PI 6.5 STRL IVOR (GLOVE) ×2 IMPLANT
GOWN STRL NON-REIN LRG LVL3 (GOWN DISPOSABLE) ×4 IMPLANT
GOWN STRL REIN XL XLG (GOWN DISPOSABLE) ×2 IMPLANT
KIT BASIN OR (CUSTOM PROCEDURE TRAY) ×2 IMPLANT
KIT ROOM TURNOVER OR (KITS) ×2 IMPLANT
NEEDLE 18GX1X1/2 (RX/OR ONLY) (NEEDLE) ×2 IMPLANT
NEEDLE HYPO 25GX1X1/2 BEV (NEEDLE) ×2 IMPLANT
NS IRRIG 1000ML POUR BTL (IV SOLUTION) ×2 IMPLANT
PACK SURGICAL SETUP 50X90 (CUSTOM PROCEDURE TRAY) ×2 IMPLANT
PAD ARMBOARD 7.5X6 YLW CONV (MISCELLANEOUS) ×4 IMPLANT
SOAP 2 % CHG 4 OZ (WOUND CARE) ×2 IMPLANT
SPONGE GAUZE 2X2 STER 10/PKG (GAUZE/BANDAGES/DRESSINGS) ×1
SUT ETHILON 3 0 PS 1 (SUTURE) ×2 IMPLANT
SUT MNCRL AB 4-0 PS2 18 (SUTURE) ×2 IMPLANT
SYR 20CC LL (SYRINGE) ×4 IMPLANT
SYR 30ML LL (SYRINGE) IMPLANT
SYR 3ML LL SCALE MARK (SYRINGE) ×2 IMPLANT
SYR 5ML LL (SYRINGE) ×2 IMPLANT
SYR CONTROL 10ML LL (SYRINGE) ×2 IMPLANT
SYRINGE 10CC LL (SYRINGE) ×2 IMPLANT
TOWEL OR 17X24 6PK STRL BLUE (TOWEL DISPOSABLE) ×2 IMPLANT
TOWEL OR 17X26 10 PK STRL BLUE (TOWEL DISPOSABLE) ×2 IMPLANT
WATER STERILE IRR 1000ML POUR (IV SOLUTION) ×2 IMPLANT

## 2012-05-23 NOTE — Anesthesia Procedure Notes (Signed)
Procedure Name: LMA Insertion Date/Time: 05/23/2012 9:41 AM Performed by: Wray Kearns A Pre-anesthesia Checklist: Patient identified, Timeout performed, Emergency Drugs available, Suction available and Patient being monitored Patient Re-evaluated:Patient Re-evaluated prior to inductionOxygen Delivery Method: Circle system utilized Preoxygenation: Pre-oxygenation with 100% oxygen Intubation Type: IV induction Ventilation: Mask ventilation without difficulty LMA: LMA inserted LMA Size: 5.0 Tube type: Oral Placement Confirmation: breath sounds checked- equal and bilateral and positive ETCO2 Tube secured with: Tape Dental Injury: Teeth and Oropharynx as per pre-operative assessment

## 2012-05-23 NOTE — Op Note (Signed)
OPERATIVE NOTE  PROCEDURE: 1. right internal jugular vein vein tunneled dialysis catheter placement 2. right internal jugular vein vein cannulation under ultrasound guidance  PRE-OPERATIVE DIAGNOSIS: end-stage renal failure  POST-OPERATIVE DIAGNOSIS: same as above  SURGEON: Nilda Simmer, MD  ANESTHESIA: general  ESTIMATED BLOOD LOSS: minimal  FINDING(S): 1.  Tips of the catheter in the right atrium on fluoroscopy 2.  No obvious pneumothorax on fluoroscopy  SPECIMEN(S):  none  INDICATIONS:   Cynthia Pugh is a 36 y.o. female who presents with end stage renal disease.  The patient presents for tunneled dialysis catheter placement.  The patient is aware the risks of tunneled dialysis catheter placement include but are not limited to: bleeding, infection, central venous injury, pneumothorax, possible venous stenosis, possible malpositioning in the venous system, and possible infections related to long-term catheter presence. The patient was aware of these risks and agreed to proceed.  DESCRIPTION: After written full informed consent was obtained from the patient, the patient was taken back to the operating room.  Prior to induction, the patient was given IV antibiotics.  After obtaining adequate sedation, the patient was prepped and draped in the standard fashion for a chest or neck tunneled dialysis catheter placement.  Under ultrasound guidance, the right internal jugular vein vein was cannulated with the 18 gauge needle.  A J-wire was then placed down in the inferior vena cava under fluroscopic guidance.  The wire was then secured in place with a clamp to the drapes.  I then made stab incisions at the neck and exit sites.  I dissected from the chest to the neck and dilated the subcutaneous tunnel with a plastic dilator.  The wire was then unclamped and I removed the needle.  The skin tract and venotomy was dilated serially with dilators.  Finally, the dilator-sheath was placed  under fluroscopic guidance into the superior vena cava.  The dilator and wire were removed.  A 23 cm Diatek catheter was placed under fluoroscopic guidance down into the right atrium.  The sheath was broken and peeled away while holding the catheter cuff at the level of the skin.  The back end of this catheter was transected, revealing the two lumens of this catheter.  The ports were docked onto these two lumens.  The catheter hub was then screwed into place.  Each port was tested by aspirating and flushing.  No resistance was noted.  Each port was then thoroughly flushed with heparinized saline.  The catheter was secured in placed with two interrupted stitches of 3-0 Nylon tied to the catheter.  The neck incision was closed with a U-stitch of 4-0 Monocryl.  The neck and chest incision were cleaned and sterile bandages applied.  Each port was then loaded with concentrated heparin (1000 Units/mL) at the manufacturer recommended volumes to each port.  Sterile caps were applied to each port.  On completion fluoroscopy, the tips of the catheter were in the right atrium, and there was no evidence of pneumothorax.  COMPLICATIONS: none  CONDITION: stable   Leonides Sake, MD [facility] [place] Office: [phone] Pager: [phone]  05/23/2012, 10:11 AM

## 2012-05-23 NOTE — Anesthesia Preprocedure Evaluation (Signed)
Anesthesia Evaluation  Patient identified by MRN, date of birth, ID band Patient awake    Reviewed: Allergy & Precautions, H&P , NPO status , Patient's Chart, lab work & pertinent test results  Airway Mallampati: II TM Distance: >3 FB Neck ROM: Full    Dental   Pulmonary  breath sounds clear to auscultation        Cardiovascular hypertension, Rhythm:Regular Rate:Normal     Neuro/Psych    GI/Hepatic   Endo/Other  Morbid obesity  Renal/GU CRFRenal disease     Musculoskeletal   Abdominal (+) + obese,   Peds  Hematology   Anesthesia Other Findings   Reproductive/Obstetrics                           Anesthesia Physical Anesthesia Plan  ASA: III  Anesthesia Plan: General   Post-op Pain Management:    Induction: Intravenous  Airway Management Planned: LMA  Additional Equipment:   Intra-op Plan:   Post-operative Plan: Extubation in OR  Informed Consent: I have reviewed the patients History and Physical, chart, labs and discussed the procedure including the risks, benefits and alternatives for the proposed anesthesia with the patient or authorized representative who has indicated his/her understanding and acceptance.     Plan Discussed with: CRNA and Surgeon  Anesthesia Plan Comments:         Anesthesia Quick Evaluation

## 2012-05-23 NOTE — Anesthesia Postprocedure Evaluation (Signed)
  Anesthesia Post-op Note  Patient: Cynthia Pugh  Procedure(s) Performed: Procedure(s) (LRB) with comments: INSERTION OF DIALYSIS CATHETER (Right) - insertion of dialysis catheter  Patient Location: PACU  Anesthesia Type: General  Level of Consciousness: awake and alert   Airway and Oxygen Therapy: Patient Spontanous Breathing  Post-op Pain: mild  Post-op Assessment: Post-op Vital signs reviewed, Patient's Cardiovascular Status Stable, Respiratory Function Stable, Patent Airway, No signs of Nausea or vomiting and Pain level controlled  Post-op Vital Signs: stable  Complications: No apparent anesthesia complications

## 2012-05-23 NOTE — H&P (Signed)
[facility] [place]  Brief History and Physical  History of Present Illness  Cynthia Pugh is a 36 y.o. female who presents with chief complaint: end stage renal disease.  The patient presents today for tunneled dialysis catheter placement.    Past Medical History  Diagnosis Date  . Hypertension   . Lupus   . Renal disorder   . Anemia     Past Surgical History  Procedure Date  . Abdominal hysterectomy   . Av fistula placement 08/25/2010  . Dilation and curettage of uterus     x2    History   Social History  . Marital Status: Single    Spouse Name: N/A    Number of Children: N/A  . Years of Education: N/A   Occupational History  . Not on file.   Social History Main Topics  . Smoking status: Never Smoker   . Smokeless tobacco: Never Used  . Alcohol Use: No  . Drug Use: No  . Sexually Active: Not on file   Other Topics Concern  . Not on file   Social History Narrative  . No narrative on file    Family History  Problem Relation Age of Onset  . Adopted: Yes    No current facility-administered medications on file prior to encounter.   Current Outpatient Prescriptions on File Prior to Encounter  Medication Sig Dispense Refill  . telmisartan (MICARDIS) 40 MG tablet Take 40 mg by mouth daily.      . calcium carbonate (TUMS - DOSED IN MG ELEMENTAL CALCIUM) 500 MG chewable tablet Chew 4 tablets by mouth 4 (four) times daily. 4 tablets after every meal then 4 tablets at bedtime      . furosemide (LASIX) 80 MG tablet Take 80 mg by mouth 4 (four) times daily.      Marland Kitchen labetalol (NORMODYNE) 100 MG tablet Take 100 mg by mouth every morning.      . mycophenolate (CELLCEPT) 250 MG capsule Take 750 mg by mouth 2 (two) times daily.      . paricalcitol (ZEMPLAR) 1 MCG capsule Take 1 mcg by mouth daily.        Allergies  Allergen Reactions  . Aspirin Other (See Comments)    unknown    Review of Systems: As listed above, otherwise  negative.  Physical Examination  Filed Vitals:   05/22/12 1700 05/23/12 0705  BP:  149/94  Pulse:  85  Temp:  98.3 F (36.8 C)  TempSrc:  Oral  Resp:  18  Height: 5\' 6"  (1.676 m)   Weight: 290 lb (131.543 kg)   SpO2:  97%    General: A&O x 3, WDWN  Pulmonary: Sym exp, good air movt, CTAB, no rales, rhonchi, & wheezing  Cardiac: RRR, Nl S1, S2, no Murmurs, rubs or gallops  Gastrointestinal: soft, NTND, -G/R, - HSM, - masses, - CVAT B  Musculoskeletal: M/S 5/5 throughout , Extremities without ischemic changes , R wrist incision healed, palpable thrill and bruit  Laboratory See iStat  Medical Decision Making  Cynthia Pugh is a 36 y.o. female who presents with: end stage renal disease.   The patient is scheduled for: tunneled dialysis catheter placement The patient is aware the risks of tunneled dialysis catheter placement include but are not limited to: bleeding, infection, central venous injury, pneumothorax, possible venous stenosis, possible malpositioning in the venous system, and possible infections related to long-term catheter presence.   The patient is aware of the  risks and agrees to proceed.  Leonides Sake, MD [facility] [place] Office: [phone] Pager: [phone]  05/23/2012, 7:55 AM

## 2012-05-23 NOTE — Progress Notes (Signed)
Spoke with marty renal pa informed of pts surgery k=4.6 location will instruct pt to return to dialysis tomorrow at regular time pt told and understands

## 2012-05-23 NOTE — Progress Notes (Signed)
Report given to stephanie rn as caegiver 

## 2012-05-23 NOTE — Transfer of Care (Signed)
Immediate Anesthesia Transfer of Care Note  Patient: Cynthia Pugh  Procedure(s) Performed: Procedure(s) (LRB) with comments: INSERTION OF DIALYSIS CATHETER (Right) - insertion of dialysis catheter  Patient Location: PACU  Anesthesia Type: General  Level of Consciousness: oriented, sedated, patient cooperative and responds to stimulation  Airway & Oxygen Therapy: Patient Spontanous Breathing and Patient connected to nasal cannula oxygen  Post-op Assessment: Report given to PACU RN, Post -op Vital signs reviewed and stable, Patient moving all extremities and Patient moving all extremities X 4  Post vital signs: Reviewed and stable  Complications: No apparent anesthesia complications

## 2012-05-23 NOTE — Preoperative (Signed)
Beta Blockers   Reason not to administer Beta Blockers:Not Applicable 

## 2012-05-24 ENCOUNTER — Encounter (HOSPITAL_COMMUNITY): Payer: Self-pay | Admitting: Vascular Surgery

## 2012-05-30 ENCOUNTER — Other Ambulatory Visit: Payer: Self-pay | Admitting: *Deleted

## 2012-05-31 ENCOUNTER — Encounter (HOSPITAL_COMMUNITY): Payer: Self-pay | Admitting: Pharmacy Technician

## 2012-06-04 ENCOUNTER — Encounter (HOSPITAL_COMMUNITY): Payer: Self-pay | Admitting: *Deleted

## 2012-06-04 ENCOUNTER — Other Ambulatory Visit: Payer: Self-pay | Admitting: *Deleted

## 2012-06-04 MED ORDER — SODIUM CHLORIDE 0.9 % IV SOLN: INTRAVENOUS | Status: DC

## 2012-06-04 MED ORDER — DEXTROSE 5 % IV SOLN
1.5000 g | INTRAVENOUS | Status: AC
  Administered 2012-06-05: 1.5 g via INTRAVENOUS
  Filled 2012-06-04: qty 1.5

## 2012-06-05 ENCOUNTER — Encounter (HOSPITAL_COMMUNITY): Payer: Self-pay | Admitting: Critical Care Medicine

## 2012-06-05 ENCOUNTER — Ambulatory Visit (HOSPITAL_COMMUNITY)
Admission: RE | Admit: 2012-06-05 | Discharge: 2012-06-05 | Disposition: A | Discharge status: Home / Self Care | Payer: Medicaid Other | Source: Ambulatory Visit | Attending: Vascular Surgery | Admitting: Vascular Surgery

## 2012-06-05 ENCOUNTER — Encounter (HOSPITAL_COMMUNITY)
Admission: RE | Disposition: A | Discharge status: Home / Self Care | Payer: Self-pay | Source: Ambulatory Visit | Attending: Vascular Surgery

## 2012-06-05 ENCOUNTER — Ambulatory Visit (HOSPITAL_COMMUNITY): Payer: Medicaid Other | Admitting: Critical Care Medicine

## 2012-06-05 DIAGNOSIS — T82898A Other specified complication of vascular prosthetic devices, implants and grafts, initial encounter: Secondary | ICD-10-CM | POA: Insufficient documentation

## 2012-06-05 DIAGNOSIS — I120 Hypertensive chronic kidney disease with stage 5 chronic kidney disease or end stage renal disease: Secondary | ICD-10-CM | POA: Insufficient documentation

## 2012-06-05 DIAGNOSIS — N186 End stage renal disease: Secondary | ICD-10-CM | POA: Insufficient documentation

## 2012-06-05 DIAGNOSIS — Y832 Surgical operation with anastomosis, bypass or graft as the cause of abnormal reaction of the patient, or of later complication, without mention of misadventure at the time of the procedure: Secondary | ICD-10-CM | POA: Insufficient documentation

## 2012-06-05 DIAGNOSIS — M329 Systemic lupus erythematosus, unspecified: Secondary | ICD-10-CM | POA: Insufficient documentation

## 2012-06-05 HISTORY — DX: Personal history of other medical treatment: Z92.89

## 2012-06-05 LAB — POCT I-STAT, CHEM 8
Calcium, Ion: 1.04 mmol/L — ABNORMAL LOW (ref 1.12–1.23)
Chloride: 99 mEq/L (ref 96–112)
HCT: 32 % — ABNORMAL LOW (ref 36.0–46.0)
Potassium: 3.2 mEq/L — ABNORMAL LOW (ref 3.5–5.1)
Sodium: 138 mEq/L (ref 135–145)

## 2012-06-05 LAB — PROTIME-INR
INR: 1.04 (ref 0.00–1.49)
Prothrombin Time: 13.5 seconds (ref 11.6–15.2)

## 2012-06-05 SURGERY — REVISON OF ARTERIOVENOUS FISTULA
Anesthesia: General | Site: Arm Upper | Laterality: Right | Wound class: Clean

## 2012-06-05 MED ORDER — SODIUM CHLORIDE 0.9 % IR SOLN
Status: DC | PRN
  Administered 2012-06-05: 08:00:00

## 2012-06-05 MED ORDER — HYDROMORPHONE HCL PF 1 MG/ML IJ SOLN
0.2500 mg | INTRAMUSCULAR | Status: DC | PRN
  Administered 2012-06-05: 0.5 mg via INTRAVENOUS

## 2012-06-05 MED ORDER — ONDANSETRON HCL 4 MG/2ML IJ SOLN
INTRAMUSCULAR | Status: AC
  Filled 2012-06-05: qty 2

## 2012-06-05 MED ORDER — MIDAZOLAM HCL 5 MG/5ML IJ SOLN
INTRAMUSCULAR | Status: DC | PRN
  Administered 2012-06-05: 2 mg via INTRAVENOUS

## 2012-06-05 MED ORDER — HYDROMORPHONE HCL PF 1 MG/ML IJ SOLN
INTRAMUSCULAR | Status: AC
  Filled 2012-06-05: qty 1

## 2012-06-05 MED ORDER — ONDANSETRON HCL 4 MG/2ML IJ SOLN
4.0000 mg | Freq: Once | INTRAMUSCULAR | Status: AC | PRN
  Administered 2012-06-05: 4 mg via INTRAVENOUS

## 2012-06-05 MED ORDER — LIDOCAINE HCL (CARDIAC) 20 MG/ML IV SOLN
INTRAVENOUS | Status: DC | PRN
  Administered 2012-06-05: 30 mg via INTRAVENOUS

## 2012-06-05 MED ORDER — ALBUMIN HUMAN 5 % IV SOLN
INTRAVENOUS | Status: DC | PRN
  Administered 2012-06-05: 09:00:00 via INTRAVENOUS

## 2012-06-05 MED ORDER — THROMBIN 20000 UNITS EX SOLR
CUTANEOUS | Status: DC | PRN
  Administered 2012-06-05: 08:00:00 via TOPICAL

## 2012-06-05 MED ORDER — PHENYLEPHRINE HCL 10 MG/ML IJ SOLN
INTRAMUSCULAR | Status: DC | PRN
  Administered 2012-06-05 (×2): 40 ug via INTRAVENOUS
  Administered 2012-06-05: 120 ug via INTRAVENOUS
  Administered 2012-06-05: 40 ug via INTRAVENOUS
  Administered 2012-06-05: 80 ug via INTRAVENOUS
  Administered 2012-06-05: 40 ug via INTRAVENOUS

## 2012-06-05 MED ORDER — PROPOFOL 10 MG/ML IV BOLUS
INTRAVENOUS | Status: DC | PRN
  Administered 2012-06-05: 150 mg via INTRAVENOUS

## 2012-06-05 MED ORDER — BUPIVACAINE HCL (PF) 0.5 % IJ SOLN
INTRAMUSCULAR | Status: AC
  Filled 2012-06-05: qty 30

## 2012-06-05 MED ORDER — ACETAMINOPHEN 10 MG/ML IV SOLN: 1000.0000 mg | Freq: Once | INTRAVENOUS | Status: DC | PRN

## 2012-06-05 MED ORDER — SODIUM CHLORIDE 0.9 % IV SOLN
INTRAVENOUS | Status: DC | PRN
  Administered 2012-06-05 (×2): via INTRAVENOUS

## 2012-06-05 MED ORDER — ONDANSETRON HCL 4 MG/2ML IJ SOLN
INTRAMUSCULAR | Status: DC | PRN
  Administered 2012-06-05: 4 mg via INTRAVENOUS

## 2012-06-05 MED ORDER — OXYCODONE HCL 5 MG PO TABS: 5.0000 mg | ORAL_TABLET | ORAL | Status: DC | PRN

## 2012-06-05 MED ORDER — LIDOCAINE-EPINEPHRINE (PF) 1 %-1:200000 IJ SOLN
INTRAMUSCULAR | Status: AC
  Filled 2012-06-05: qty 10

## 2012-06-05 MED ORDER — SODIUM CHLORIDE 0.9 % IR SOLN
Status: DC | PRN
  Administered 2012-06-05: 1000 mL

## 2012-06-05 MED ORDER — FENTANYL CITRATE 0.05 MG/ML IJ SOLN
INTRAMUSCULAR | Status: DC | PRN
  Administered 2012-06-05 (×5): 25 ug via INTRAVENOUS
  Administered 2012-06-05: 100 ug via INTRAVENOUS
  Administered 2012-06-05: 25 ug via INTRAVENOUS

## 2012-06-05 MED ORDER — THROMBIN 20000 UNITS EX SOLR
CUTANEOUS | Status: AC
  Filled 2012-06-05: qty 20000

## 2012-06-05 SURGICAL SUPPLY — 39 items
CANISTER SUCTION 2500CC (MISCELLANEOUS) ×2 IMPLANT
CLIP TI MEDIUM 6 (CLIP) ×2 IMPLANT
CLIP TI WIDE RED SMALL 6 (CLIP) ×2 IMPLANT
CLOTH BEACON ORANGE TIMEOUT ST (SAFETY) ×2 IMPLANT
COVER PROBE W GEL 5X96 (DRAPES) ×2 IMPLANT
COVER SURGICAL LIGHT HANDLE (MISCELLANEOUS) ×2 IMPLANT
DECANTER SPIKE VIAL GLASS SM (MISCELLANEOUS) IMPLANT
DERMABOND ADVANCED (GAUZE/BANDAGES/DRESSINGS) ×1
DERMABOND ADVANCED .7 DNX12 (GAUZE/BANDAGES/DRESSINGS) ×1 IMPLANT
DRAIN PENROSE 1/2X12 LTX STRL (WOUND CARE) IMPLANT
ELECT REM PT RETURN 9FT ADLT (ELECTROSURGICAL) ×2
ELECTRODE REM PT RTRN 9FT ADLT (ELECTROSURGICAL) ×1 IMPLANT
GAUZE SPONGE 4X4 16PLY XRAY LF (GAUZE/BANDAGES/DRESSINGS) ×2 IMPLANT
GLOVE BIO SURGEON STRL SZ7 (GLOVE) ×2 IMPLANT
GLOVE BIOGEL PI IND STRL 6.5 (GLOVE) ×1 IMPLANT
GLOVE BIOGEL PI IND STRL 7.5 (GLOVE) ×2 IMPLANT
GLOVE BIOGEL PI IND STRL 8 (GLOVE) ×1 IMPLANT
GLOVE BIOGEL PI INDICATOR 6.5 (GLOVE) ×1
GLOVE BIOGEL PI INDICATOR 7.5 (GLOVE) ×2
GLOVE BIOGEL PI INDICATOR 8 (GLOVE) ×1
GLOVE ECLIPSE 6.5 STRL STRAW (GLOVE) ×2 IMPLANT
GLOVE SS BIOGEL STRL SZ 7 (GLOVE) ×1 IMPLANT
GLOVE SUPERSENSE BIOGEL SZ 7 (GLOVE) ×1
GOWN STRL NON-REIN LRG LVL3 (GOWN DISPOSABLE) ×6 IMPLANT
KIT BASIN OR (CUSTOM PROCEDURE TRAY) ×2 IMPLANT
KIT ROOM TURNOVER OR (KITS) ×2 IMPLANT
NS IRRIG 1000ML POUR BTL (IV SOLUTION) ×2 IMPLANT
PACK CV ACCESS (CUSTOM PROCEDURE TRAY) ×2 IMPLANT
PAD ARMBOARD 7.5X6 YLW CONV (MISCELLANEOUS) ×4 IMPLANT
SPONGE SURGIFOAM ABS GEL 100 (HEMOSTASIS) ×2 IMPLANT
SUT MNCRL AB 4-0 PS2 18 (SUTURE) ×2 IMPLANT
SUT PROLENE 6 0 BV (SUTURE) ×6 IMPLANT
SUT PROLENE 7 0 BV 1 (SUTURE) ×2 IMPLANT
SUT VIC AB 3-0 SH 27 (SUTURE) ×5
SUT VIC AB 3-0 SH 27X BRD (SUTURE) ×5 IMPLANT
TOWEL OR 17X24 6PK STRL BLUE (TOWEL DISPOSABLE) ×2 IMPLANT
TOWEL OR 17X26 10 PK STRL BLUE (TOWEL DISPOSABLE) ×2 IMPLANT
UNDERPAD 30X30 INCONTINENT (UNDERPADS AND DIAPERS) ×2 IMPLANT
WATER STERILE IRR 1000ML POUR (IV SOLUTION) ×2 IMPLANT

## 2012-06-05 NOTE — Anesthesia Postprocedure Evaluation (Signed)
  Anesthesia Post-op Note  Patient: Cynthia Pugh  Procedure(s) Performed: Procedure(s) (LRB) with comments: REVISON OF ARTERIOVENOUS FISTULA (Right) - SUPERFICIALIZATION AND LIGATION OF RIGHT BRACHIAL /CEPHALIC ATERIOVENOUS FISTULA AND SIDE BRANCHS  Patient Location: PACU  Anesthesia Type: General  Level of Consciousness: awake, alert  and oriented  Airway and Oxygen Therapy: Patient Spontanous Breathing and Patient connected to nasal cannula oxygen  Post-op Pain: mild  Post-op Assessment: Post-op Vital signs reviewed and Patient's Cardiovascular Status Stable  Post-op Vital Signs: stable  Complications: No apparent anesthesia complications

## 2012-06-05 NOTE — Preoperative (Signed)
Beta Blockers   Reason not to administer Beta Blockers:Not Applicable 

## 2012-06-05 NOTE — Anesthesia Procedure Notes (Signed)
Procedure Name: LMA Insertion Date/Time: 06/05/2012 7:40 AM Performed by: Elon Alas Pre-anesthesia Checklist: Patient identified, Timeout performed, Emergency Drugs available, Suction available and Patient being monitored Patient Re-evaluated:Patient Re-evaluated prior to inductionOxygen Delivery Method: Circle system utilized Preoxygenation: Pre-oxygenation with 100% oxygen Intubation Type: IV induction Ventilation: Mask ventilation without difficulty LMA: LMA inserted LMA Size: 4.0 Placement Confirmation: positive ETCO2 and breath sounds checked- equal and bilateral Tube secured with: Tape Dental Injury: Teeth and Oropharynx as per pre-operative assessment

## 2012-06-05 NOTE — Op Note (Signed)
OPERATIVE NOTE   PROCEDURE: 1. Superficialization of right brachiocephalic arteriovenous fistula   2. Ligation of right brachiocephalic arteriovenous fistula  Side branches  PRE-OPERATIVE DIAGNOSIS: Difficult to cannulated right brachiocephalic arteriovenous fistula    POST-OPERATIVE DIAGNOSIS: same as above   SURGEON: Leonides Sake, MD  ASSISTANT(S): Lianne Cure, PAC  ANESTHESIA: general  ESTIMATED BLOOD LOSS: 100 cc  FINDING(S): 1. Easily palpable thrill in transposed fistula 2. Fistula 7-8 mm throughout except short mid-segment area which is only 5 mm 3. Inflammation and scarring around distal 1/2 of fistula  SPECIMEN(S):  none  INDICATIONS:   Cynthia Pugh is a 36 y.o. female who  presents with deep right brachiocephalic arteriovenous fistula.  We discussed that superficialization of the fistula was necessary to facilitate future cannulation of the fistula.  In the process, ligation of side branches would be completed.  Risk, benefits, and alternatives to access surgery were discussed.  The patient is aware the risks include but are not limited to: bleeding, infection, steal syndrome, nerve damage, ischemic monomelic neuropathy, failure to mature, need for additional procedures, death and stroke.  The patient agrees to proceed forward with the procedure.  DESCRIPTION: After obtain full informed written consent, the patient was brought back to the operating room and placed supine upon the operating table.  The patient received IV antibiotics prior to induction.  After obtaining adequate anesthesia, the patient was prepped and draped in the standard fashion for: right arm access procedure.  The brachiocephalic fistula was identified with the Sonosite and marked on the arm.  I made a longitudinal incision over the fistula and dissected down to the fistula.  I mobilized the fistula from the anastomosis to the axillary extent of the fistula, in the process ligating side branches with  silk ties.  The fistula appeared: distal half of fistula has substantial scarring and inflammation around the fistula but otherwise 7-8 mm throughout except short mid-segment which was 5 mm.   I sharply lysed connective tissue surrounding the fistula.  I made a shelf in the adjacent subcutaneous tissue that was ~6 mm deep from the skin.  I transposed the fistula onto this shelf and secured the fistula in placed with interrupted 3-0 Vicryl stitches, taking care to avoid constricting the fistula.  The deep subcutaneous tissue was reapproximated with interrupted 3-0 Vicryl to abolish some of the dead space.  A running subcutaneous stitch of 3-0 Vicryl was used to reapproximated the superficial subcutaneous tissue.  The skin was then reapproximated with a running subcuticular 4-0 Monocryl.  The skin was cleaned, dried, and the skin closure reinforced with Dermabond.  There was an easily palpable thrill at the end of this case.  COMPLICATIONS: none  CONDITION: stable  Leonides Sake, MD [facility] [place] Office: [phone] Pager: [phone]  06/05/2012, 9:53 AM

## 2012-06-05 NOTE — Progress Notes (Signed)
Good bruit and thrill right arm. Stated no numbness or tingling to fingers

## 2012-06-05 NOTE — Transfer of Care (Signed)
Immediate Anesthesia Transfer of Care Note  Patient: Cynthia Pugh  Procedure(s) Performed: Procedure(s) (LRB) with comments: REVISON OF ARTERIOVENOUS FISTULA (Right) - SUPERFICIALIZATION AND LIGATION OF RIGHT BRACHIAL /CEPHALIC ATERIOVENOUS FISTULA AND SIDE BRANCHS  Patient Location: PACU  Anesthesia Type: General  Level of Consciousness: awake and alert   Airway & Oxygen Therapy: Patient Spontanous Breathing and Patient connected to nasal cannula oxygen  Post-op Assessment: Report given to PACU RN, Post -op Vital signs reviewed and stable and Patient moving all extremities X 4  Post vital signs: Reviewed and stable  Complications: No apparent anesthesia complications

## 2012-06-05 NOTE — Anesthesia Preprocedure Evaluation (Addendum)
Anesthesia Evaluation  Patient identified by MRN, date of birth, ID band Patient awake    Reviewed: Allergy & Precautions, H&P , NPO status , Patient's Chart, lab work & pertinent test results  Airway Mallampati: II TM Distance: >3 FB Neck ROM: Full    Dental  (+) Dental Advisory Given and Edentulous Upper   Pulmonary  breath sounds clear to auscultation        Cardiovascular hypertension, Pt. on medications Rhythm:Regular Rate:Normal     Neuro/Psych    GI/Hepatic   Endo/Other  Morbid obesity  Renal/GU ESRFRenal disease     Musculoskeletal   Abdominal (+) + obese,   Peds  Hematology   Anesthesia Other Findings   Reproductive/Obstetrics                         Anesthesia Physical Anesthesia Plan  ASA: III  Anesthesia Plan: General   Post-op Pain Management:    Induction: Intravenous  Airway Management Planned: LMA  Additional Equipment:   Intra-op Plan:   Post-operative Plan: Extubation in OR  Informed Consent: I have reviewed the patients History and Physical, chart, labs and discussed the procedure including the risks, benefits and alternatives for the proposed anesthesia with the patient or authorized representative who has indicated his/her understanding and acceptance.   Dental advisory given  Plan Discussed with: Anesthesiologist and Surgeon  Anesthesia Plan Comments: (ESRD last HD 10/7 K- 3.2 Lupus Htn Obesity  Plan GA with LMA)       Anesthesia Quick Evaluation

## 2012-06-05 NOTE — H&P (Signed)
[facility] [place]  Brief History and Physical  History of Present Illness  Cynthia Pugh is a 36 y.o. female who presents with chief complaint: difficulty cannulating R BC AVF.  The patient presents today for superficialization of R BC AVF.    Past Medical History  Diagnosis Date  . Hypertension   . Lupus   . Renal disorder   . Anemia   . History of blood transfusion     Past Surgical History  Procedure Date  . Abdominal hysterectomy   . Av fistula placement 08/25/2010  . Dilation and curettage of uterus     x2  . Insertion of dialysis catheter 05/23/2012    Procedure: INSERTION OF DIALYSIS CATHETER;  Surgeon: Fransisco Hertz, MD;  Location: [facility] OR;  Service: Vascular;  Laterality: Right;  insertion of dialysis catheter    History   Social History  . Marital Status: Single    Spouse Name: N/A    Number of Children: N/A  . Years of Education: N/A   Occupational History  . Not on file.   Social History Main Topics  . Smoking status: Never Smoker   . Smokeless tobacco: Never Used  . Alcohol Use: No  . Drug Use: No  . Sexually Active: Not on file   Other Topics Concern  . Not on file   Social History Narrative  . No narrative on file    Family History  Problem Relation Age of Onset  . Adopted: Yes    No current facility-administered medications on file prior to encounter.   Current Outpatient Prescriptions on File Prior to Encounter  Medication Sig Dispense Refill  . calcium carbonate (TUMS - DOSED IN MG ELEMENTAL CALCIUM) 500 MG chewable tablet Chew 4 tablets by mouth 4 (four) times daily. 4 tablets after every meal then 4 tablets at bedtime      . telmisartan (MICARDIS) 40 MG tablet Take 40 mg by mouth daily.        Allergies  Allergen Reactions  . Aspirin Other (See Comments)    unknown    Review of Systems: As listed above, otherwise negative.  Physical Examination  Filed Vitals:   06/04/12 1300 06/05/12 0622 06/05/12  0639  BP:  145/98 132/92  Pulse:  87 91  Temp:  97 F (36.1 C) 98.2 F (36.8 C)  TempSrc:  Oral Oral  Resp:  18 18  Height: 5\' 6"  (1.676 m)    Weight: 280 lb (127.007 kg)    SpO2:  97% 98%    General: A&O x 3, WDWN  Pulmonary: Sym exp, good air movt, CTAB, no rales, rhonchi, & wheezing  Cardiac: RRR, Nl S1, S2, no Murmurs, rubs or gallops  Gastrointestinal: soft, NTND, -G/R, - HSM, - masses, - CVAT B  Musculoskeletal: M/S 5/5 throughout , Extremities without ischemic changes , palpable thrill and bruit in R BC AVF, hematoma in upper arm  Laboratory See iStat  Medical Decision Making  Cynthia Pugh is a 36 y.o. female who presents with: difficulty cannulating R BC AVF.   The patient is scheduled for: superficialization of R BC AVF  Risk, benefits, and alternatives to access surgery were discussed.  The patient is aware the risks include but are not limited to: bleeding, infection, steal syndrome, nerve damage, ischemic monomelic neuropathy, failure to mature, and need for additional procedures.  The patient is aware of the risks and agrees to proceed.  Leonides Sake, MD [facility] [place] Office: [phone] Pager: [phone]  06/05/2012, 7:24 AM

## 2012-06-25 DIAGNOSIS — N186 End stage renal disease: Secondary | ICD-10-CM

## 2012-07-13 ENCOUNTER — Other Ambulatory Visit: Payer: Self-pay | Admitting: *Deleted

## 2012-07-13 DIAGNOSIS — N186 End stage renal disease: Secondary | ICD-10-CM

## 2012-07-13 DIAGNOSIS — Z4931 Encounter for adequacy testing for hemodialysis: Secondary | ICD-10-CM

## 2012-07-19 ENCOUNTER — Encounter: Payer: Self-pay | Admitting: Vascular Surgery

## 2012-07-19 ENCOUNTER — Other Ambulatory Visit: Payer: Self-pay | Admitting: *Deleted

## 2012-07-20 ENCOUNTER — Ambulatory Visit: Payer: PRIVATE HEALTH INSURANCE | Admitting: Vascular Surgery

## 2012-07-30 ENCOUNTER — Encounter (HOSPITAL_COMMUNITY)
Admission: RE | Admit: 2012-07-30 | Discharge: 2012-07-30 | Disposition: A | Discharge status: Home / Self Care | Payer: Medicare Other | Source: Ambulatory Visit | Attending: Vascular Surgery | Admitting: Vascular Surgery

## 2012-07-30 DIAGNOSIS — N186 End stage renal disease: Secondary | ICD-10-CM | POA: Insufficient documentation

## 2012-07-30 DIAGNOSIS — Z452 Encounter for adjustment and management of vascular access device: Secondary | ICD-10-CM | POA: Insufficient documentation

## 2012-07-30 NOTE — H&P (Signed)
[facility] SHORT STAY H&P  CC: ESRD   HPI: Cynthia Pugh is a 36 y.o. female who has been on HD through  functioning Hemodialysis access in the right upper extremity. They are here for HD catheter removal. Pt. denies signs of steal syndrome.  Past Medical History  Diagnosis Date  . Hypertension   . Lupus   . Renal disorder   . Anemia   . History of blood transfusion     FH:  Non-Contributory  Social HX History  Substance Use Topics  . Smoking status: Never Smoker   . Smokeless tobacco: Never Used  . Alcohol Use: No    Allergies Allergies  Allergen Reactions  . Aspirin Other (See Comments)    unknown    Medications Current Outpatient Prescriptions  Medication Sig Dispense Refill  . b complex-vitamin c-folic acid (NEPHRO-VITE) 0.8 MG TABS Take 0.8 mg by mouth at bedtime.      . calcium carbonate (TUMS - DOSED IN MG ELEMENTAL CALCIUM) 500 MG chewable tablet Chew 4 tablets by mouth 4 (four) times daily. 4 tablets after every meal then 4 tablets at bedtime      . oxyCODONE (OXY IR/ROXICODONE) 5 MG immediate release tablet Take 5 mg by mouth every 4 (four) hours as needed. For pain      . oxyCODONE (ROXICODONE) 5 MG immediate release tablet Take 1 tablet (5 mg total) by mouth every 4 (four) hours as needed for pain.  30 tablet  0  . telmisartan (MICARDIS) 40 MG tablet Take 40 mg by mouth daily.        Labs COAG Lab Results  Component Value Date   INR 1.04 06/05/2012   INR 1.17 05/23/2012   INR 1.1 01/22/2009   No results found for this basename: PTT    PHYSICAL EXAM  Filed Vitals:   07/30/12 1242  BP: 142/107  Pulse: 95  Temp: 98.7 F (37.1 C)  Resp: 20    General:  WDWN in NAD HENT: WNL Eyes: Pupils equal Pulmonary: normal non-labored breathing  Cardiac: RRR, Skin: normal, no cyanosis, jaundice, pallor or bruising Vascular Exam/Pulses: 2+ radial pulses in RIGHT upper extremity. Extremities without ischemic changes, no Gangrene , no  cellulitis; no open wounds;   There is a good thrill and good bruit in the AVF. Hand grip is 5/5 and sensation in digits is intact;   Impression: This is a 36 y.o. female who has a functioning HD access.  Plan: Removal of Right IJ HD catheter Yusuke Beza J 07/30/2012  1:26 PM

## 2012-07-30 NOTE — Progress Notes (Signed)
[facility] Catheter Removal Procedure Note  Diagnosis: ESRD with Functioning AVF/AVGG  Plan:  Remove right diatek catheter  Consent signed:  yes Time out completed:  yes Coumadin:  no PT/INR (if applicable):   Other labs:   Procedure: 1.  Sterile prepping and draping over catheter area 2. 6 ml 2% lidocaine plain instilled at removal site. 3.  right catheter removed in its entirety with cuff in tact. 4.  Complications: none  5. Tip of catheter sent for culture:  no   Patient tolerated procedure well:  yes Pressure held, no bleeding noted, dressing applied Instructions given to the pt regarding wound care and bleeding.  Other:  Cynthia Pugh 07/30/2012 12:59 PM

## 2012-08-16 ENCOUNTER — Encounter: Payer: Self-pay | Admitting: Vascular Surgery

## 2012-08-17 ENCOUNTER — Ambulatory Visit: Payer: PRIVATE HEALTH INSURANCE | Admitting: Vascular Surgery

## 2013-07-16 ENCOUNTER — Ambulatory Visit (HOSPITAL_COMMUNITY)
Admission: RE | Admit: 2013-07-16 | Discharge: 2013-07-16 | Disposition: A | Discharge status: Home / Self Care | Payer: Medicare Other | Source: Ambulatory Visit | Attending: Nephrology | Admitting: Nephrology

## 2013-07-16 ENCOUNTER — Other Ambulatory Visit (HOSPITAL_COMMUNITY): Payer: Self-pay | Admitting: Nephrology

## 2013-07-16 DIAGNOSIS — R079 Chest pain, unspecified: Secondary | ICD-10-CM | POA: Insufficient documentation

## 2013-07-16 DIAGNOSIS — Z789 Other specified health status: Secondary | ICD-10-CM

## 2013-07-16 DIAGNOSIS — N186 End stage renal disease: Secondary | ICD-10-CM | POA: Insufficient documentation

## 2013-07-16 DIAGNOSIS — N19 Unspecified kidney failure: Secondary | ICD-10-CM | POA: Insufficient documentation

## 2013-07-16 DIAGNOSIS — N052 Unspecified nephritic syndrome with diffuse membranous glomerulonephritis: Secondary | ICD-10-CM | POA: Insufficient documentation

## 2013-07-16 DIAGNOSIS — M329 Systemic lupus erythematosus, unspecified: Secondary | ICD-10-CM | POA: Insufficient documentation

## 2013-08-10 ENCOUNTER — Emergency Department (HOSPITAL_COMMUNITY)
Admission: EM | Admit: 2013-08-10 | Discharge: 2013-08-10 | Disposition: A | Discharge status: Home / Self Care | Payer: Medicare Other | Attending: Emergency Medicine | Admitting: Emergency Medicine

## 2013-08-10 ENCOUNTER — Emergency Department (HOSPITAL_COMMUNITY): Payer: Medicare Other

## 2013-08-10 ENCOUNTER — Encounter (HOSPITAL_COMMUNITY): Payer: Self-pay | Admitting: Emergency Medicine

## 2013-08-10 DIAGNOSIS — M6281 Muscle weakness (generalized): Secondary | ICD-10-CM | POA: Insufficient documentation

## 2013-08-10 DIAGNOSIS — Z9189 Other specified personal risk factors, not elsewhere classified: Secondary | ICD-10-CM | POA: Insufficient documentation

## 2013-08-10 DIAGNOSIS — Z888 Allergy status to other drugs, medicaments and biological substances status: Secondary | ICD-10-CM | POA: Insufficient documentation

## 2013-08-10 DIAGNOSIS — I120 Hypertensive chronic kidney disease with stage 5 chronic kidney disease or end stage renal disease: Secondary | ICD-10-CM | POA: Insufficient documentation

## 2013-08-10 DIAGNOSIS — D649 Anemia, unspecified: Secondary | ICD-10-CM | POA: Insufficient documentation

## 2013-08-10 DIAGNOSIS — N186 End stage renal disease: Secondary | ICD-10-CM | POA: Insufficient documentation

## 2013-08-10 DIAGNOSIS — R9431 Abnormal electrocardiogram [ECG] [EKG]: Secondary | ICD-10-CM | POA: Insufficient documentation

## 2013-08-10 DIAGNOSIS — Z79899 Other long term (current) drug therapy: Secondary | ICD-10-CM | POA: Insufficient documentation

## 2013-08-10 DIAGNOSIS — M329 Systemic lupus erythematosus, unspecified: Secondary | ICD-10-CM | POA: Insufficient documentation

## 2013-08-10 DIAGNOSIS — R42 Dizziness and giddiness: Secondary | ICD-10-CM | POA: Insufficient documentation

## 2013-08-10 DIAGNOSIS — R531 Weakness: Secondary | ICD-10-CM

## 2013-08-10 DIAGNOSIS — Z992 Dependence on renal dialysis: Secondary | ICD-10-CM | POA: Insufficient documentation

## 2013-08-10 LAB — COMPREHENSIVE METABOLIC PANEL
ALT: 8 U/L (ref 0–35)
AST: 8 U/L (ref 0–37)
Albumin: 4 g/dL (ref 3.5–5.2)
Alkaline Phosphatase: 39 U/L (ref 39–117)
CO2: 17 mEq/L — ABNORMAL LOW (ref 19–32)
Chloride: 103 mEq/L (ref 96–112)
GFR calc non Af Amer: 2 mL/min — ABNORMAL LOW (ref >90)
Potassium: 5.8 mEq/L — ABNORMAL HIGH (ref 3.5–5.1)
Total Bilirubin: 0.5 mg/dL (ref 0.3–1.2)

## 2013-08-10 LAB — CBC WITH DIFFERENTIAL/PLATELET
Basophils Absolute: 0 10*3/uL (ref 0.0–0.1)
Basophils Relative: 0 % (ref 0–1)
HCT: 31.2 % — ABNORMAL LOW (ref 36.0–46.0)
Hemoglobin: 10.7 g/dL — ABNORMAL LOW (ref 12.0–15.0)
Lymphocytes Relative: 43 % (ref 12–46)
MCHC: 34.3 g/dL (ref 30.0–36.0)
Monocytes Absolute: 0.2 10*3/uL (ref 0.1–1.0)
Neutro Abs: 2 10*3/uL (ref 1.7–7.7)
Neutrophils Relative %: 50 % (ref 43–77)
RDW: 13.5 % (ref 11.5–15.5)
WBC: 4 10*3/uL (ref 4.0–10.5)

## 2013-08-10 NOTE — ED Provider Notes (Signed)
CSN: [id number]     Arrival date & time 08/10/13  0003 History   First MD Initiated Contact with Patient 08/10/13 0220     Chief Complaint  Patient presents with  . Weakness  . Dizziness   (Consider location/radiation/quality/duration/timing/severity/associated sxs/prior Treatment) HPI History provided by patient. End stage renal disease who gets dialysis 3 times per week.  2 weeks ago traveled to [place]. She did get dialysis set up and that are performed the first week she was there. Her last dialysis was a week ago today. She has had some progressive weakness and lightheaded dizziness. She denies any chest pain or difficulty breathing. No fevers or rash or chills. Symptoms moderate in severity and worse with ambulating or exertion. Symptoms worse tonight. No difficulty with speech or gait. No unilateral weakness or numbness. No headache. No nausea vomiting or diarrhea. Symptoms moderate in severity Past Medical History  Diagnosis Date  . Hypertension   . Lupus   . Renal disorder   . Anemia   . History of blood transfusion    Past Surgical History  Procedure Laterality Date  . Abdominal hysterectomy    . Av fistula placement  08/25/2010  . Dilation and curettage of uterus      x2  . Insertion of dialysis catheter  05/23/2012    Procedure: INSERTION OF DIALYSIS CATHETER;  Surgeon: Fransisco Hertz, MD;  Location: [facility] OR;  Service: Vascular;  Laterality: Right;  insertion of dialysis catheter   Family History  Problem Relation Age of Onset  . Adopted: Yes   History  Substance Use Topics  . Smoking status: Never Smoker   . Smokeless tobacco: Never Used  . Alcohol Use: No   OB History   Grav Para Term Preterm Abortions TAB SAB Ect Mult Living                 Review of Systems  Constitutional: Negative for fever and chills.  Eyes: Negative for visual disturbance.  Respiratory: Negative for shortness of breath.   Cardiovascular: Negative for chest pain.   Gastrointestinal: Negative for abdominal pain.  Genitourinary: Negative for flank pain.  Musculoskeletal: Negative for back pain, neck pain and neck stiffness.  Skin: Negative for rash.  Neurological: Positive for dizziness. Negative for speech difficulty, numbness and headaches.  All other systems reviewed and are negative.    Allergies  Aspirin  Home Medications   Current Outpatient Rx  Name  Route  Sig  Dispense  Refill  . b complex-vitamin c-folic acid (NEPHRO-VITE) 0.8 MG TABS tablet   Oral   Take 1 tablet by mouth at bedtime.         Marland Kitchen lanthanum (FOSRENOL) 500 MG chewable tablet   Oral   Chew 500 mg by mouth 3 (three) times daily with meals.         Marland Kitchen telmisartan (MICARDIS) 40 MG tablet   Oral   Take 40 mg by mouth daily.          BP 138/97  Pulse 68  Temp(Src) 97.3 F (36.3 C) (Oral)  Resp 19  Ht 5\' 6"  (1.676 m)  Wt 262 lb (118.842 kg)  BMI 42.31 kg/m2  SpO2 100% Physical Exam  Constitutional: She is oriented to person, place, and time. She appears well-developed and well-nourished.  HENT:  Head: Normocephalic and atraumatic.  Eyes: EOM are normal. Pupils are equal, round, and reactive to light.  Neck: Neck supple.  Cardiovascular: Normal rate, regular rhythm and intact  distal pulses.   Pulmonary/Chest: Effort normal. No stridor. No respiratory distress.  Lung sounds clear and moving good air  Abdominal: Soft. Bowel sounds are normal. She exhibits no distension. There is no tenderness.  Musculoskeletal: Normal range of motion. She exhibits no tenderness.  Neurological: She is alert and oriented to person, place, and time.  Skin: Skin is warm and dry.    ED Course  Procedures (including critical care time) Labs Review Labs Reviewed  CBC WITH DIFFERENTIAL - Abnormal; Notable for the following:    RBC 3.07 (*)    Hemoglobin 10.7 (*)    HCT 31.2 (*)    MCV 101.6 (*)    MCH 34.9 (*)    All other components within normal limits  COMPREHENSIVE  METABOLIC PANEL - Abnormal; Notable for the following:    Potassium 5.8 (*)    CO2 17 (*)    BUN 89 (*)    Creatinine, Ser 18.98 (*)    GFR calc non Af Amer 2 (*)    GFR calc Af Amer 2 (*)    All other components within normal limits   Imaging Review Dg Chest Portable 1 View  08/10/2013   CLINICAL DATA:  Weakness and dizziness  EXAM: PORTABLE CHEST - 1 VIEW  COMPARISON:  Prior radiograph from 07/16/2013  FINDINGS: Mild cardiomegaly is stable as compared to prior study.  The lungs are hypoinflated. There is mild diffuse pulmonary vascular congestion without frank pulmonary edema. No airspace consolidation or pleural effusion identified. No pneumothorax.  No acute osseous abnormality identified.  IMPRESSION: Cardiomegaly with mild diffuse pulmonary vascular congestion.   Electronically Signed   By: Rise Mu M.D.   On: 08/10/2013 03:12    EKG Interpretation    Date/Time:  Saturday August 10 2013 02:44:42 EST Ventricular Rate:  59 PR Interval:  140 QRS Duration: 98 QT Interval:  449 QTC Calculation: 445 R Axis:   -6 Text Interpretation:  Sinus rhythm Low voltage, precordial leads Nonspecific ST abnormality Abnormal ECG No significant change since last tracing Confirmed by Nery Kalisz  MD, Antoniette Peake [phone]) on 08/11/2013 1:17:18 AM           4:20 AM reviewed CXR, labs and VS with Renal on call, Dr Marisue Humble recs able to go to her dialysis center for schedule dialysis at [facility]. PT agreeable to plan.  She is afebrile and in no resp distress.  MDM  DX: weakness, dizziness, ESRD  CXR, labs, ECG Plan urgent dialysis this am REN consulted - discharge to her dialysis center   Sunnie Nielsen, MD 08/11/13 [phone]

## 2013-08-10 NOTE — ED Notes (Signed)
Pt. reports generalized weakness and dizziness , pt. stated she missed 3 hemodialysis treatment while she was out of the country [place] ). Denies fever or chills.

## 2013-08-10 NOTE — ED Notes (Signed)
Has been out of the country x 2 weeks.  Has dialysis week 1 but did not have dialysis at all this past week.  Feels generally weak, ill.  States she needs dialysis.

## 2013-08-10 NOTE — ED Notes (Signed)
Pt discharged with instructions to be at her dialysis center at 6am today.  Pt verbalized understanding and told me she was going to go there immediately.

## 2013-10-11 ENCOUNTER — Encounter: Payer: Self-pay | Admitting: Vascular Surgery

## 2013-10-11 ENCOUNTER — Other Ambulatory Visit: Payer: Self-pay | Admitting: *Deleted

## 2013-10-11 DIAGNOSIS — Z01810 Encounter for preprocedural cardiovascular examination: Secondary | ICD-10-CM

## 2013-10-11 DIAGNOSIS — N186 End stage renal disease: Secondary | ICD-10-CM

## 2013-10-15 ENCOUNTER — Encounter: Payer: Self-pay | Admitting: Vascular Surgery

## 2013-10-16 ENCOUNTER — Ambulatory Visit: Payer: Medicare Other | Admitting: Vascular Surgery

## 2013-10-30 ENCOUNTER — Ambulatory Visit: Payer: Medicare Other | Admitting: Vascular Surgery

## 2013-11-01 ENCOUNTER — Encounter (HOSPITAL_COMMUNITY): Payer: Medicare Other

## 2013-11-01 ENCOUNTER — Ambulatory Visit: Payer: Medicare Other | Admitting: Vascular Surgery

## 2013-11-01 ENCOUNTER — Other Ambulatory Visit (HOSPITAL_COMMUNITY): Payer: Medicare Other

## 2014-05-17 ENCOUNTER — Encounter (HOSPITAL_COMMUNITY): Payer: Self-pay | Admitting: Emergency Medicine

## 2014-05-17 ENCOUNTER — Emergency Department (HOSPITAL_COMMUNITY)
Admission: EM | Admit: 2014-05-17 | Discharge: 2014-05-17 | Disposition: A | Discharge status: Home / Self Care | Payer: Medicare Other | Attending: Emergency Medicine | Admitting: Emergency Medicine

## 2014-05-17 DIAGNOSIS — Z992 Dependence on renal dialysis: Secondary | ICD-10-CM | POA: Insufficient documentation

## 2014-05-17 DIAGNOSIS — Z8739 Personal history of other diseases of the musculoskeletal system and connective tissue: Secondary | ICD-10-CM | POA: Insufficient documentation

## 2014-05-17 DIAGNOSIS — Z862 Personal history of diseases of the blood and blood-forming organs and certain disorders involving the immune mechanism: Secondary | ICD-10-CM | POA: Diagnosis not present

## 2014-05-17 DIAGNOSIS — R5383 Other fatigue: Secondary | ICD-10-CM

## 2014-05-17 DIAGNOSIS — N186 End stage renal disease: Secondary | ICD-10-CM | POA: Diagnosis not present

## 2014-05-17 DIAGNOSIS — R5381 Other malaise: Secondary | ICD-10-CM | POA: Insufficient documentation

## 2014-05-17 DIAGNOSIS — I120 Hypertensive chronic kidney disease with stage 5 chronic kidney disease or end stage renal disease: Secondary | ICD-10-CM | POA: Diagnosis not present

## 2014-05-17 DIAGNOSIS — Z79899 Other long term (current) drug therapy: Secondary | ICD-10-CM | POA: Diagnosis not present

## 2014-05-17 LAB — RENAL FUNCTION PANEL
ALBUMIN: 3.7 g/dL (ref 3.5–5.2)
ANION GAP: 24 — AB (ref 5–15)
BUN: 87 mg/dL — ABNORMAL HIGH (ref 6–23)
CO2: 17 mEq/L — ABNORMAL LOW (ref 19–32)
Calcium: 9.5 mg/dL (ref 8.4–10.5)
Chloride: 97 mEq/L (ref 96–112)
Creatinine, Ser: 18.46 mg/dL — ABNORMAL HIGH (ref 0.50–1.10)
GFR calc non Af Amer: 2 mL/min — ABNORMAL LOW (ref >90)
GFR, EST AFRICAN AMERICAN: 2 mL/min — AB (ref >90)
GLUCOSE: 90 mg/dL (ref 70–99)
POTASSIUM: 5.3 meq/L (ref 3.7–5.3)
Phosphorus: 8.4 mg/dL — ABNORMAL HIGH (ref 2.3–4.6)
SODIUM: 138 meq/L (ref 137–147)

## 2014-05-17 LAB — CBC
HCT: 29.8 % — ABNORMAL LOW (ref 36.0–46.0)
HEMOGLOBIN: 10.3 g/dL — AB (ref 12.0–15.0)
MCH: 33.8 pg (ref 26.0–34.0)
MCHC: 34.6 g/dL (ref 30.0–36.0)
MCV: 97.7 fL (ref 78.0–100.0)
Platelets: 151 10*3/uL (ref 150–400)
RBC: 3.05 MIL/uL — ABNORMAL LOW (ref 3.87–5.11)
RDW: 13.7 % (ref 11.5–15.5)
WBC: 3.4 10*3/uL — ABNORMAL LOW (ref 4.0–10.5)

## 2014-05-17 MED ORDER — ONDANSETRON 4 MG PO TBDP
4.0000 mg | ORAL_TABLET | Freq: Once | ORAL | Status: AC
  Administered 2014-05-17: 4 mg via ORAL
  Filled 2014-05-17: qty 1

## 2014-05-17 MED ORDER — ALTEPLASE 2 MG IJ SOLR
2.0000 mg | Freq: Once | INTRAMUSCULAR | Status: DC | PRN
  Filled 2014-05-17: qty 2

## 2014-05-17 MED ORDER — LIDOCAINE HCL (PF) 1 % IJ SOLN: 5.0000 mL | INTRAMUSCULAR | Status: DC | PRN

## 2014-05-17 MED ORDER — PENTAFLUOROPROP-TETRAFLUOROETH EX AERO
1.0000 "application " | INHALATION_SPRAY | CUTANEOUS | Status: DC | PRN
  Filled 2014-05-17: qty 30

## 2014-05-17 MED ORDER — HEPARIN SODIUM (PORCINE) 1000 UNIT/ML DIALYSIS
1000.0000 [IU] | INTRAMUSCULAR | Status: DC | PRN
  Filled 2014-05-17: qty 1

## 2014-05-17 MED ORDER — NEPRO/CARBSTEADY PO LIQD
237.0000 mL | ORAL | Status: DC | PRN
  Filled 2014-05-17: qty 237

## 2014-05-17 MED ORDER — SODIUM CHLORIDE 0.9 % IV SOLN: 100.0000 mL | INTRAVENOUS | Status: DC | PRN

## 2014-05-17 MED ORDER — DOXERCALCIFEROL 4 MCG/2ML IV SOLN
6.0000 ug | Freq: Once | INTRAVENOUS | Status: AC
  Administered 2014-05-17: 6 ug via INTRAVENOUS
  Filled 2014-05-17: qty 4

## 2014-05-17 MED ORDER — DOXERCALCIFEROL 4 MCG/2ML IV SOLN
INTRAVENOUS | Status: AC
  Filled 2014-05-17: qty 4

## 2014-05-17 MED ORDER — HEPARIN SODIUM (PORCINE) 1000 UNIT/ML DIALYSIS
4000.0000 [IU] | Freq: Once | INTRAMUSCULAR | Status: DC
  Filled 2014-05-17: qty 4

## 2014-05-17 MED ORDER — LIDOCAINE-PRILOCAINE 2.5-2.5 % EX CREA
1.0000 "application " | TOPICAL_CREAM | CUTANEOUS | Status: DC | PRN
  Filled 2014-05-17: qty 5

## 2014-05-17 NOTE — Progress Notes (Addendum)
Made aware by the [facility] that Ms Dehaas has not attended dialysis since 05/09/14 (went out of town on a cruise and did not make arrangements for HD, stating that HD out of the country was inordinately expensive).  Was to have dialyzed today at [facility] but her flight was delayed. She presented to the ED for dialysis, ambulatory, O2 sats normal, hypertensive. ECG no peaked t's or obvious conduction abnormalities. Spoke with Dr. Elesa Massed in the ED - they will send pt up for HD We will do her labs here in the unit Barring any complications during HD could be discharged home afterwards. If issues were to arise that necessitate admission, she can go back to the ED from hemodialysis per Dr. Elesa Massed  HD prescription: 4 hours, 2K, 2.25 Ca, BFR 400, DFR A1.5, profile 2, R upper arm AVF, heparin 4000 Hectorol 6 mcg TIW (venofer 50 qwed, no aranesp) Stat renal and CBC to be done in HD and bath adjusted as necessary  Camille Bal, MD [facility] [phone] Pager 05/17/2014, 4:54 PM

## 2014-05-17 NOTE — ED Notes (Signed)
Pt reports not having dialysis since last Friday 05/09/14- pt normally M/W/F treatment.  Pt was out of the country and unable to get treatment.  Pt currently reports weakness and numbness throughout body.  Pt goes to [facility].

## 2014-05-17 NOTE — ED Provider Notes (Addendum)
TIME SEEN: 4:45 PM  CHIEF COMPLAINT: Missed dialysis  HPI: Patient is a 38 y.o. F with history of hypertension, lupus, end-stage renal disease on hemodialysis Monday, Wednesday and Friday last received dialysis on 05/09/14. She reports that she went on a cruise and that is why she missed dialysis. Her plan was to go to her dialysis clinic today but she did not get back in time. She states on Thursday, 2 days ago she started having numbness in her mouth but has now spread to her complete body from head to toe bilaterally. She states this symptom is normal for her when she needs dialysis. She denies any focal weakness. No fevers, chills, cough, chest pain or shortness of breath, headache, blurry vision. She has had some nausea. No vomiting or diarrhea. No abdominal pain. She is here today requesting dialysis.  ROS: See HPI Constitutional: no fever  Eyes: no drainage  ENT: no runny nose   Cardiovascular:  no chest pain  Resp: no SOB  GI: no vomiting GU: no dysuria Integumentary: no rash  Allergy: no hives  Musculoskeletal: no leg swelling  Neurological: no slurred speech ROS otherwise negative  PAST MEDICAL HISTORY/PAST SURGICAL HISTORY:  Past Medical History  Diagnosis Date  . Hypertension   . Lupus   . Renal disorder   . Anemia   . History of blood transfusion     MEDICATIONS:  Prior to Admission medications   Medication Sig Start Date End Date Taking? Authorizing Provider  b complex-vitamin c-folic acid (NEPHRO-VITE) 0.8 MG TABS tablet Take 1 tablet by mouth at bedtime.    Historical Provider, MD  lanthanum (FOSRENOL) 500 MG chewable tablet Chew 500 mg by mouth 3 (three) times daily with meals.    Historical Provider, MD  telmisartan (MICARDIS) 40 MG tablet Take 40 mg by mouth daily.    Historical Provider, MD    ALLERGIES:  Allergies  Allergen Reactions  . Aspirin Other (See Comments)    Childhood seizures    SOCIAL HISTORY:  History  Substance Use Topics  . Smoking  status: Never Smoker   . Smokeless tobacco: Never Used  . Alcohol Use: No    FAMILY HISTORY: Family History  Problem Relation Age of Onset  . Adopted: Yes    EXAM: BP 157/115  Pulse 78  Temp(Src) 98.2 F (36.8 C) (Oral)  Resp 18  Ht  (1.676 m)  SpO2 98% CONSTITUTIONAL: Alert and oriented and responds appropriately to questions. Well-appearing; well-nourished HEAD: Normocephalic EYES: Conjunctivae clear, PERRL ENT: normal nose; no rhinorrhea; moist mucous membranes; pharynx without lesions noted NECK: Supple, no meningismus, no LAD  CARD: RRR; S1 and S2 appreciated; no murmurs, no clicks, no rubs, no gallops RESP: Normal chest excursion without splinting or tachypnea; breath sounds clear and equal bilaterally; no wheezes, no rhonchi, no rales, no hypoxia or respiratory distress, speaking full sentences ABD/GI: Normal bowel sounds; non-distended; soft, non-tender, no rebound, no guarding BACK:  The back appears normal and is non-tender to palpation, there is no CVA tenderness EXT: Normal ROM in all joints; non-tender to palpation; no edema; normal capillary refill; no cyanosis    SKIN: Normal color for age and race; warm NEURO: Moves all extremities equally, sensation to light touch intact diffusely, cranial nerves II through XII intact, normal gait PSYCH: The patient's mood and manner are appropriate. Grooming and personal hygiene are appropriate.  MEDICAL DECISION MAKING: Patient here needing dialysis. She is mildly hypertensive but otherwise hemodynamically stable. No respiratory distress, shortness  of breath, hypoxia or increased work of breathing. Lungs are clear to auscultation. EKG shows no interval changes or peak T waves. Discussed with Dr. Eliott Nine with nephrology who recommends sending her to hemodialysis center for dialysis. She did not feel the patient had labs drawn in the ED at this time. They will draw appropriate labs in the hemodialysis suite. If she is stable  during dialysis, they will discharge her home. If complications arise, they will send her back to the emergency department for workup and admission. Patient comfortable with this plan. Discussed with patient that I do not feel she needs imaging of her brain given she is having diffuse, bilateral numbness. Discussed with her that if this was a infarct or hemorrhage, she would not be a week or talking giving that this infarct would be extremely enlarged to encompass her entire body.        EKG Interpretation  Date/Time:  Saturday May 17 2014 16:23:33 EDT Ventricular Rate:  76 PR Interval:  134 QRS Duration: 82 QT Interval:  426 QTC Calculation: 479 R Axis:   -20 Text Interpretation:  Normal sinus rhythm Anterior infarct , age undetermined Abnormal ECG No significant change since last tracing Confirmed by Sondos Wolfman,  DO, Elba Dendinger [phone]) on 05/17/2014 4:38:59 PM         Layla Maw Wyndell Cardiff, DO 05/17/14 1655  Emanie Behan N Pieper Kasik, DO 05/17/14 1655

## 2014-05-17 NOTE — Procedures (Signed)
I have personally attended this patient's dialysis session.  2K bath pending labs. Pre-weight 117.5 (EDW 115) AVF cannulated - BFR 400 - 15 gu needles  Camille Bal, MD [facility] [phone] Pager 05/17/2014, 5:23 PM

## 2014-05-17 NOTE — Progress Notes (Signed)
Pt tolerated HD tx without s/sx of adverse rxns, Denies any discomfort at this time, in no acute distress,Discharge to home as ordered, Pt alert/oriented.VSS(see doc flowsheets). Family waiting at ED unit.

## 2016-11-29 ENCOUNTER — Emergency Department (HOSPITAL_COMMUNITY)
Admission: EM | Admit: 2016-11-29 | Discharge: 2016-11-30 | Disposition: A | Discharge status: Home / Self Care | Payer: No Typology Code available for payment source | Attending: Emergency Medicine | Admitting: Emergency Medicine

## 2016-11-29 ENCOUNTER — Encounter (HOSPITAL_COMMUNITY): Payer: Self-pay

## 2016-11-29 ENCOUNTER — Emergency Department (HOSPITAL_COMMUNITY): Payer: No Typology Code available for payment source

## 2016-11-29 DIAGNOSIS — Y999 Unspecified external cause status: Secondary | ICD-10-CM | POA: Diagnosis not present

## 2016-11-29 DIAGNOSIS — Z79899 Other long term (current) drug therapy: Secondary | ICD-10-CM | POA: Diagnosis not present

## 2016-11-29 DIAGNOSIS — I10 Essential (primary) hypertension: Secondary | ICD-10-CM | POA: Insufficient documentation

## 2016-11-29 DIAGNOSIS — S6992XA Unspecified injury of left wrist, hand and finger(s), initial encounter: Secondary | ICD-10-CM | POA: Diagnosis present

## 2016-11-29 DIAGNOSIS — Y939 Activity, unspecified: Secondary | ICD-10-CM | POA: Diagnosis not present

## 2016-11-29 DIAGNOSIS — Y92410 Unspecified street and highway as the place of occurrence of the external cause: Secondary | ICD-10-CM | POA: Diagnosis not present

## 2016-11-29 DIAGNOSIS — S60052A Contusion of left little finger without damage to nail, initial encounter: Secondary | ICD-10-CM | POA: Diagnosis not present
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
## 2016-11-30 DIAGNOSIS — S60052A Contusion of left little finger without damage to nail, initial encounter: Secondary | ICD-10-CM | POA: Diagnosis not present

## 2016-11-30 MED ORDER — HYDROCODONE-ACETAMINOPHEN 5-325 MG PO TABS: 1.0000 | ORAL_TABLET | ORAL | 0 refills | Status: DC | PRN

## 2016-11-30 MED ORDER — HYDROCODONE-ACETAMINOPHEN 5-325 MG PO TABS
1.0000 | ORAL_TABLET | Freq: Once | ORAL | Status: AC
  Administered 2016-11-30: 1 via ORAL
  Filled 2016-11-30: qty 1
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
## 2016-11-30 NOTE — Progress Notes (Signed)
Orthopedic Tech Progress Note Patient Details:  Cynthia Pugh 1976-06-27 [id number]  Ortho Devices Type of Ortho Device: Finger splint Ortho Device/Splint Location: lue middle finger Ortho Device/Splint Interventions: Ordered, Application   Trinna Post 11/30/2016, 1:17 AM

## 2016-11-30 NOTE — ED Notes (Signed)
Ortho tech arrived 

## 2016-11-30 NOTE — ED Notes (Signed)
Pt ambulatory and independent at discharge.  Verbalized understanding of discharge instructions and importance of not driving while under the influence of pain medication.  Pt has ride home.

## 2019-03-17 DIAGNOSIS — I10 Essential (primary) hypertension: Secondary | ICD-10-CM | POA: Insufficient documentation

## 2019-03-17 DIAGNOSIS — R002 Palpitations: Secondary | ICD-10-CM | POA: Insufficient documentation

## 2019-03-17 HISTORY — DX: Palpitations: R00.2

## 2019-03-17 HISTORY — DX: Essential (primary) hypertension: I10

## 2019-03-17 NOTE — Progress Notes (Deleted)
Cardiology Office Note    Date:  03/17/2019   ID:  Cynthia Pugh, DOB 11/01/1975, MRN [id number]  PCP:  Fleet Contras, MD  Cardiologist:  Fransico Him, MD   No chief complaint on file.   History of Present Illness:  Cynthia Pugh is a 43 y.o. female who is being seen today for the evaluation of palpitations at the request of Elmarie Shiley, MD.  This is a 44yo female with a hx of HTN and SLE who has been having palpitations.    Past Medical History:  Diagnosis Date  . Anemia   . History of blood transfusion   . Hypertension   . Lupus   . Renal disorder     Past Surgical History:  Procedure Laterality Date  . ABDOMINAL HYSTERECTOMY    . AV FISTULA PLACEMENT  08/25/2010  . DILATION AND CURETTAGE OF UTERUS     x2  . INSERTION OF DIALYSIS CATHETER  05/23/2012   Procedure: INSERTION OF DIALYSIS CATHETER;  Surgeon: Conrad Brookside, MD;  Location: Home;  Service: Vascular;  Laterality: Right;  insertion of dialysis catheter    Current Medications: No outpatient medications have been marked as taking for the 03/18/19 encounter (Appointment) with Sueanne Margarita, MD.    Allergies:   Aspirin   Social History   Socioeconomic History  . Marital status: Single    Spouse name: Not on file  . Number of children: Not on file  . Years of education: Not on file  . Highest education level: Not on file  Occupational History  . Not on file  Social Needs  . Financial resource strain: Not on file  . Food insecurity    Worry: Not on file    Inability: Not on file  . Transportation needs    Medical: Not on file    Non-medical: Not on file  Tobacco Use  . Smoking status: Never Smoker  . Smokeless tobacco: Never Used  Substance and Sexual Activity  . Alcohol use: No  . Drug use: No  . Sexual activity: Not on file  Lifestyle  . Physical activity    Days per week: Not on file    Minutes per session: Not on file  . Stress: Not on file  Relationships  . Social Product manager on phone: Not on file    Gets together: Not on file    Attends religious service: Not on file    Active member of club or organization: Not on file    Attends meetings of clubs or organizations: Not on file    Relationship status: Not on file  Other Topics Concern  . Not on file  Social History Narrative  . Not on file     Family History:  The patient's family history is not on file. She was adopted.   ROS:   Please see the history of present illness.    ROS All other systems reviewed and are negative.  No flowsheet data found.     PHYSICAL EXAM:   VS:  There were no vitals taken for this visit.   GEN: Well nourished, well developed, in no acute distress  HEENT: normal  Neck: no JVD, carotid bruits, or masses Cardiac: RRR; no murmurs, rubs, or gallops,no edema.  Intact distal pulses bilaterally.  Respiratory:  clear to auscultation bilaterally, normal work of breathing GI: soft, nontender, nondistended, + BS MS: no deformity or atrophy  Skin: warm and dry,  no rash Neuro:  Alert and Oriented x 3, Strength and sensation are intact Psych: euthymic mood, full affect  Wt Readings from Last 3 Encounters:  11/29/16 274 lb 3 oz (124.4 kg)  05/17/14 253 lb 8.5 oz (115 kg)  08/10/13 262 lb (118.8 kg)      Studies/Labs Reviewed:   EKG:  EKG is ordered today.  The ekg ordered today demonstrates ***  Recent Labs: No results found for requested labs within last 8760 hours.   Lipid Panel No results found for: CHOL, TRIG, HDL, CHOLHDL, VLDL, LDLCALC, LDLDIRECT  Additional studies/ records that were reviewed today include:  none    ASSESSMENT:    1. Palpitations   2. Essential hypertension      PLAN:  In order of problems listed above:  1. Palpitations -unclear etiology -I will get an event monitor to assess further  2.  Hypertension -her BP is well controlled -continue Telmisartan 40mg  daily     Medication Adjustments/Labs and Tests Ordered:  Current medicines are reviewed at length with the patient today.  Concerns regarding medicines are outlined above.  Medication changes, Labs and Tests ordered today are listed in the Patient Instructions below.  There are no Patient Instructions on file for this visit.   Signed, Armanda Magicraci Theron Cumbie, MD  03/17/2019 7:26 PM    [facility] [address], [place], [place]  [address] Phone: [phone]; Fax: [phone]

## 2019-03-18 ENCOUNTER — Ambulatory Visit: Payer: Medicare Other | Admitting: Cardiology

## 2019-03-18 ENCOUNTER — Encounter

## 2019-04-25 ENCOUNTER — Telehealth: Payer: Self-pay | Admitting: *Deleted

## 2019-04-25 NOTE — Telephone Encounter (Signed)

## 2019-04-28 NOTE — Progress Notes (Signed)
Cardiology Consult Note    Date:  04/29/2019   ID:  Cynthia Pugh, DOB 10/04/1975, MRN [id number]  PCP:  Primitivo GauzeMattingly, Michael, MD  Cardiologist:  Armanda Magicraci Turner, MD   Chief Complaint  Patient presents with  . Palpitations    History of Present Illness:  Cynthia Pugh is a 43 y.o. female who is being seen today for the evaluation of palpitations at the request of Zetta BillsPatel, Jay, MD.  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  She mentioned to her nephrologist that she was having some palpitations over the past months.  She says that the palpitations started back around the time COVID started and she thought it was related to anxiety.  They lasted and few months and then went away but have now returned.  She has them on a daily basis and feels like a fluttering sensation.  She has not had any associated dizziness or syncope with them.  She has not had any chest pain or pressure, SOB, PND, orthopnea or LE edema.  She denies taking any OTC cold meds, caffiene or ETOH.   She is adopted and does not know much about her biological mom and does not know her father.  Her half sister died of complications of lupus.    Past Medical History:  Diagnosis Date  . Anemia   . ANEMIA IN CHRONIC KIDNEY DISEASE 06/14/2006   Qualifier: Diagnosis of  By: Renae FicklePaul MD, Bhakti    . ANEMIA, IRON DEFICIENCY, MICROCYTIC 08/21/2006   Annotation: sp hosp admission for 2units PRBC 10/06 Qualifier: Diagnosis of  By: Renae FicklePaul MD, Bhakti    . Arteriovenous fistula, acquired (HCC)   . Deceased-donor kidney transplant recipient   . End stage renal disease (HCC) 01/20/2012  . Essential hypertension 03/17/2019  . GERD (gastroesophageal reflux disease)   . History of blood transfusion   . Hypertension   . Hypomagnesemia   . Lupus (HCC)   . 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  Henderson BaltimoreWebb Qualifier: Diagnosis of  By: Renae FicklePaul MD, Bhakti    . Palpitations 03/17/2019  . Renal disorder   . Secondary hyperparathyroidism, renal (HCC)   . 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 ABDOMINAL HYSTERECTOMY    . AV FISTULA PLACEMENT  08/25/2010  . DILATION AND CURETTAGE OF UTERUS     x2  . INSERTION OF DIALYSIS CATHETER  05/23/2012   Procedure: INSERTION OF DIALYSIS CATHETER;  Surgeon: Fransisco HertzBrian L Chen, MD;  [facility];  Service: Vascular;  Laterality: Right;  insertion of dialysis catheter  . TRANSPLANTATION RENAL      Current Medications: Current Meds  Medication Sig  . ergocalciferol (VITAMIN D2) 1.25 MG (50000 UT) capsule Take 50,000 Units by mouth once a week.  . furosemide (LASIX) 20 MG tablet Take 20 mg by mouth daily as needed.  . metoprolol tartrate (LOPRESSOR) 25 MG tablet Take 37.5 mg by mouth 2 (two) times daily.  . mycophenolate (MYFORTIC) 180 MG EC tablet 180 mg. TAKE 3 TABLETS BY MOUTH TWO TIMES A DAY  . omeprazole (PRILOSEC) 20 MG capsule Take 20 mg by  mouth daily.  . predniSONE (DELTASONE) 5 MG tablet Take 5 mg by mouth daily with breakfast.  . PROGRAF 1 MG capsule Take 1 tablet by mouth 2 (two) times daily.  Marland Kitchen sulfamethoxazole-trimethoprim (BACTRIM) 400-80 MG tablet Take 1 tablet by mouth every Monday, Wednesday, and Friday.    Allergies:   Aspirin   Social History   Socioeconomic History  . Marital status: Single    Spouse name: Not on file  . Number of children: Not on file  . Years of education: Not on file  . Highest education level: Not on file  Occupational History  . Not on file  Social Needs  . Financial resource strain: Not on file  . Food insecurity    Worry: Not on file    Inability: Not on file  . Transportation needs     Medical: Not on file    Non-medical: Not on file  Tobacco Use  . Smoking status: Never Smoker  . Smokeless tobacco: Never Used  Substance and Sexual Activity  . Alcohol use: No  . Drug use: No  . Sexual activity: Not Currently    Comment: SINGLE  Lifestyle  . Physical activity    Days per week: Not on file    Minutes per session: Not on file  . Stress: Not on file  Relationships  . Social Musician on phone: Not on file    Gets together: Not on file    Attends religious service: Not on file    Active member of club or organization: Not on file    Attends meetings of clubs or organizations: Not on file    Relationship status: Not on file  Other Topics Concern  . Not on file  Social History Narrative  . Not on file     Family History:  The patient's family history includes Nephritis in her sister. She was adopted.   ROS:   Please see the history of present illness.    ROS All other systems reviewed and are negative.  No flowsheet data found.   PHYSICAL EXAM:   VS:  BP 120/78   Pulse 78   Ht 5\' 6"  (1.676 m)   Wt (!) 312 lb (141.5 kg)   BMI 50.36 kg/m    GEN: Well nourished, well developed, in no acute distress  HEENT: normal  Neck: no JVD, carotid bruits, or masses Cardiac: RRR; no murmurs, rubs, or gallops,no edema.  Intact distal pulses bilaterally.  Respiratory:  clear to auscultation bilaterally, normal work of breathing GI: soft, nontender, nondistended, + BS MS: no deformity or atrophy  Skin: warm and dry, no rash Neuro:  Alert and Oriented x 3, Strength and sensation are intact Psych: euthymic mood, full affect  Wt Readings from Last 3 Encounters:  04/29/19 (!) 312 lb (141.5 kg)  11/29/16 274 lb 3 oz (124.4 kg)  05/17/14 253 lb 8.5 oz (115 kg)      Studies/Labs Reviewed:   EKG:  EKG is ordered today.  The ekg ordered today demonstrates NSR with IRBBB and no ST changes  Recent Labs: No results found for requested labs within last 8760  hours.   Lipid Panel No results found for: CHOL, TRIG, HDL, CHOLHDL, VLDL, LDLCALC, LDLDIRECT  Additional studies/ records that were reviewed today include:  none  ASSESSMENT:    1. Palpitations   2. 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 Essential hypertension      PLAN:  In order of problems listed above:  1.  Palpitations - she has been on a BB and continues to have palpitations.  I will get an event monitor to assess further.   2.  Stage V lupus nephritis with ESRD - s/p renal tx in 2018.  Followed by Nephrology.  3.  Hypertension - BP controlled.  Continue on BB and ARB.   Medication Adjustments/Labs and Tests Ordered: Current medicines are reviewed at length with the patient today.  Concerns regarding medicines are outlined above.  Medication changes, Labs and Tests ordered today are listed in the Patient Instructions below.  There are no Patient Instructions on file for this visit.   Signed, Fransico Him, MD  04/29/2019 9:30 AM    [facility] [place], [place], [place]  [address] Phone: [phone]; Fax: [phone]

## 2019-04-29 ENCOUNTER — Telehealth: Payer: Self-pay | Admitting: *Deleted

## 2019-04-29 ENCOUNTER — Other Ambulatory Visit: Payer: Self-pay

## 2019-04-29 ENCOUNTER — Ambulatory Visit: Payer: Medicare Other | Admitting: Cardiology

## 2019-04-29 ENCOUNTER — Ambulatory Visit (INDEPENDENT_AMBULATORY_CARE_PROVIDER_SITE_OTHER): Payer: Medicare Other | Admitting: Cardiology

## 2019-04-29 ENCOUNTER — Encounter: Payer: Self-pay | Admitting: Cardiology

## 2019-04-29 VITALS — BP 120/78 | HR 78 | Ht 66.0 in | Wt 312.0 lb

## 2019-04-29 DIAGNOSIS — I10 Essential (primary) hypertension: Secondary | ICD-10-CM

## 2019-04-29 DIAGNOSIS — M3214 Glomerular disease in systemic lupus erythematosus: Secondary | ICD-10-CM

## 2019-04-29 DIAGNOSIS — R002 Palpitations: Secondary | ICD-10-CM | POA: Diagnosis not present

## 2019-04-29 NOTE — Addendum Note (Signed)
Addended by: Drue Novel I on: 04/29/2019 09:48 AM   Modules accepted: Orders

## 2019-04-29 NOTE — Telephone Encounter (Signed)
Attempted to contact patient to set up cardiac event monitor.  No answer, No DPR.

## 2019-04-29 NOTE — Patient Instructions (Signed)
Medication Instructions:  Your physician recommends that you continue on your current medications as directed. Please refer to the Current Medication list given to you today.  If you need a refill on your cardiac medications before your next appointment, please call your pharmacy.   Lab work: None Ordered  If you have labs (blood work) drawn today and your tests are completely normal, you will receive your results only by: . MyChart Message (if you have MyChart) OR . A paper copy in the mail If you have any lab test that is abnormal or we need to change your treatment, we will call you to review the results.  Testing/Procedures: Your physician has recommended that you wear an event monitor. Event monitors are medical devices that record the heart's electrical activity. Doctors most often us these monitors to diagnose arrhythmias. Arrhythmias are problems with the speed or rhythm of the heartbeat. The monitor is a small, portable device. You can wear one while you do your normal daily activities. This is usually used to diagnose what is causing palpitations/syncope (passing out).  Follow-Up: AS NEEDED  Any Other Special Instructions Will Be Listed Below (If Applicable).    

## 2019-05-01 NOTE — Telephone Encounter (Signed)
Enrolled patient for a 30 Day [employer] Event Monitor to be mailed. Brief instructions were gone over with the patient and she knows to expect the monitor to arrive in 3-4 days 

## 2019-05-25 ENCOUNTER — Telehealth: Payer: Self-pay | Admitting: Cardiology

## 2019-05-25 NOTE — Telephone Encounter (Addendum)
Overnight Cardiology Fellow Telephone Note  Received a page from [employer] regarding a critical event. Monitor showed SR at 80 bpm at the time that the patient reported a syncopal event. No evidence of any arrhythmia, heart block or bradycardia on the monitor.  [employer] attempted to contact the patient but she did not answer her phone. I also attempted to contact the patient on her listed phone number but phone was not answered and went to an unidentified voicemail.  Called the patient's mother who is also listed as a contact but phone also went to an unidentified voicemail.   Will continue to try to contact patient regarding potential syncopal event. Will also forward this to the patient's primary cardiologist.  Bryna Colander, MD   05:40 - reached the patient via phone and she reports that she mistakenly pressed the button to indicate syncope. She is feeling well and has not had any issues.

## 2019-11-15 NOTE — Telephone Encounter (Signed)
Late Entry Patient wore her monitor for only 16 mins. Order was cancelled.

## 2020-02-24 ENCOUNTER — Emergency Department (HOSPITAL_COMMUNITY): Payer: Medicare Other

## 2020-02-24 ENCOUNTER — Encounter (HOSPITAL_COMMUNITY): Payer: Self-pay | Admitting: Emergency Medicine

## 2020-02-24 ENCOUNTER — Inpatient Hospital Stay (HOSPITAL_COMMUNITY): Payer: Medicare Other

## 2020-02-24 ENCOUNTER — Other Ambulatory Visit: Payer: Self-pay

## 2020-02-24 ENCOUNTER — Inpatient Hospital Stay (HOSPITAL_COMMUNITY)
Admission: EM | Admit: 2020-02-24 | Discharge: 2020-02-27 | DRG: 872 | Disposition: A | Discharge status: Home / Self Care | Payer: Medicare Other | Attending: Internal Medicine | Admitting: Internal Medicine

## 2020-02-24 DIAGNOSIS — Z940 Kidney transplant status: Secondary | ICD-10-CM

## 2020-02-24 DIAGNOSIS — Y830 Surgical operation with transplant of whole organ as the cause of abnormal reaction of the patient, or of later complication, without mention of misadventure at the time of the procedure: Secondary | ICD-10-CM | POA: Diagnosis present

## 2020-02-24 DIAGNOSIS — R197 Diarrhea, unspecified: Secondary | ICD-10-CM

## 2020-02-24 DIAGNOSIS — R109 Unspecified abdominal pain: Secondary | ICD-10-CM

## 2020-02-24 DIAGNOSIS — M3214 Glomerular disease in systemic lupus erythematosus: Secondary | ICD-10-CM | POA: Diagnosis present

## 2020-02-24 DIAGNOSIS — Z7952 Long term (current) use of systemic steroids: Secondary | ICD-10-CM | POA: Diagnosis not present

## 2020-02-24 DIAGNOSIS — A072 Cryptosporidiosis: Secondary | ICD-10-CM | POA: Diagnosis present

## 2020-02-24 DIAGNOSIS — D631 Anemia in chronic kidney disease: Secondary | ICD-10-CM | POA: Diagnosis present

## 2020-02-24 DIAGNOSIS — Z79899 Other long term (current) drug therapy: Secondary | ICD-10-CM

## 2020-02-24 DIAGNOSIS — R6520 Severe sepsis without septic shock: Secondary | ICD-10-CM | POA: Diagnosis present

## 2020-02-24 DIAGNOSIS — N182 Chronic kidney disease, stage 2 (mild): Secondary | ICD-10-CM | POA: Diagnosis present

## 2020-02-24 DIAGNOSIS — R0602 Shortness of breath: Secondary | ICD-10-CM

## 2020-02-24 DIAGNOSIS — Z8616 Personal history of COVID-19: Secondary | ICD-10-CM

## 2020-02-24 DIAGNOSIS — T8619 Other complication of kidney transplant: Secondary | ICD-10-CM | POA: Diagnosis present

## 2020-02-24 DIAGNOSIS — I10 Essential (primary) hypertension: Secondary | ICD-10-CM | POA: Diagnosis present

## 2020-02-24 DIAGNOSIS — Z90710 Acquired absence of both cervix and uterus: Secondary | ICD-10-CM

## 2020-02-24 DIAGNOSIS — M7989 Other specified soft tissue disorders: Secondary | ICD-10-CM | POA: Diagnosis present

## 2020-02-24 DIAGNOSIS — N179 Acute kidney failure, unspecified: Secondary | ICD-10-CM | POA: Diagnosis present

## 2020-02-24 DIAGNOSIS — A419 Sepsis, unspecified organism: Secondary | ICD-10-CM | POA: Diagnosis present

## 2020-02-24 DIAGNOSIS — Z6841 Body Mass Index (BMI) 40.0 and over, adult: Secondary | ICD-10-CM | POA: Diagnosis not present

## 2020-02-24 DIAGNOSIS — M329 Systemic lupus erythematosus, unspecified: Secondary | ICD-10-CM

## 2020-02-24 DIAGNOSIS — I129 Hypertensive chronic kidney disease with stage 1 through stage 4 chronic kidney disease, or unspecified chronic kidney disease: Secondary | ICD-10-CM | POA: Diagnosis present

## 2020-02-24 DIAGNOSIS — K219 Gastro-esophageal reflux disease without esophagitis: Secondary | ICD-10-CM | POA: Diagnosis present

## 2020-02-24 DIAGNOSIS — N390 Urinary tract infection, site not specified: Secondary | ICD-10-CM

## 2020-02-24 DIAGNOSIS — IMO0002 Reserved for concepts with insufficient information to code with codable children: Secondary | ICD-10-CM

## 2020-02-24 DIAGNOSIS — E8770 Fluid overload, unspecified: Secondary | ICD-10-CM | POA: Diagnosis present

## 2020-02-24 DIAGNOSIS — D84821 Immunodeficiency due to drugs: Secondary | ICD-10-CM | POA: Diagnosis present

## 2020-02-24 DIAGNOSIS — D649 Anemia, unspecified: Secondary | ICD-10-CM

## 2020-02-24 LAB — SARS CORONAVIRUS 2 BY RT PCR (HOSPITAL ORDER, PERFORMED IN ~~LOC~~ HOSPITAL LAB): SARS Coronavirus 2: NEGATIVE

## 2020-02-24 LAB — CBC WITH DIFFERENTIAL/PLATELET
Abs Immature Granulocytes: 0 10*3/uL (ref 0.00–0.07)
Basophils Absolute: 0.2 10*3/uL — ABNORMAL HIGH (ref 0.0–0.1)
Basophils Relative: 1 %
Eosinophils Absolute: 0 10*3/uL (ref 0.0–0.5)
Eosinophils Relative: 0 %
HCT: 46.9 % — ABNORMAL HIGH (ref 36.0–46.0)
Hemoglobin: 14.7 g/dL (ref 12.0–15.0)
Lymphocytes Relative: 5 %
Lymphs Abs: 1 10*3/uL (ref 0.7–4.0)
MCH: 31.5 pg (ref 26.0–34.0)
MCHC: 31.3 g/dL (ref 30.0–36.0)
MCV: 100.6 fL — ABNORMAL HIGH (ref 80.0–100.0)
Monocytes Absolute: 3 10*3/uL — ABNORMAL HIGH (ref 0.1–1.0)
Monocytes Relative: 15 %
Neutro Abs: 15.6 10*3/uL — ABNORMAL HIGH (ref 1.7–7.7)
Neutrophils Relative %: 79 %
Platelets: 122 10*3/uL — ABNORMAL LOW (ref 150–400)
RBC: 4.66 MIL/uL (ref 3.87–5.11)
RDW: 12.3 % (ref 11.5–15.5)
WBC: 19.8 10*3/uL — ABNORMAL HIGH (ref 4.0–10.5)
nRBC: 0 % (ref 0.0–0.2)
nRBC: 0 /100 WBC

## 2020-02-24 LAB — URINALYSIS, ROUTINE W REFLEX MICROSCOPIC
Bilirubin Urine: NEGATIVE
Glucose, UA: NEGATIVE mg/dL
Ketones, ur: NEGATIVE mg/dL
Nitrite: NEGATIVE
Protein, ur: >=300 mg/dL — AB
RBC / HPF: >50 RBC/hpf — ABNORMAL HIGH (ref 0–5)
Specific Gravity, Urine: 1.019 (ref 1.005–1.030)
pH: 5 (ref 5.0–8.0)

## 2020-02-24 LAB — I-STAT VENOUS BLOOD GAS, ED
Acid-base deficit: 4 mmol/L — ABNORMAL HIGH (ref 0.0–2.0)
Bicarbonate: 23 mmol/L (ref 20.0–28.0)
Calcium, Ion: 1.27 mmol/L (ref 1.15–1.40)
HCT: 42 % (ref 36.0–46.0)
Hemoglobin: 14.3 g/dL (ref 12.0–15.0)
O2 Saturation: 50 %
Potassium: 4.2 mmol/L (ref 3.5–5.1)
Sodium: 138 mmol/L (ref 135–145)
TCO2: 24 mmol/L (ref 22–32)
pCO2, Ven: 49.1 mmHg (ref 44.0–60.0)
pH, Ven: 7.279 (ref 7.250–7.430)
pO2, Ven: 30 mmHg — CL (ref 32.0–45.0)

## 2020-02-24 LAB — CBC
HCT: 42.7 % (ref 36.0–46.0)
Hemoglobin: 13.2 g/dL (ref 12.0–15.0)
MCH: 32.4 pg (ref 26.0–34.0)
MCHC: 30.9 g/dL (ref 30.0–36.0)
MCV: 104.9 fL — ABNORMAL HIGH (ref 80.0–100.0)
Platelets: 66 10*3/uL — ABNORMAL LOW (ref 150–400)
RBC: 4.07 MIL/uL (ref 3.87–5.11)
RDW: 12.4 % (ref 11.5–15.5)
WBC: 15.8 10*3/uL — ABNORMAL HIGH (ref 4.0–10.5)
nRBC: 0 % (ref 0.0–0.2)

## 2020-02-24 LAB — COMPREHENSIVE METABOLIC PANEL
ALT: 46 U/L — ABNORMAL HIGH (ref 0–44)
AST: 44 U/L — ABNORMAL HIGH (ref 15–41)
Albumin: 3.8 g/dL (ref 3.5–5.0)
Alkaline Phosphatase: 49 U/L (ref 38–126)
Anion gap: 10 (ref 5–15)
BUN: 21 mg/dL — ABNORMAL HIGH (ref 6–20)
CO2: 20 mmol/L — ABNORMAL LOW (ref 22–32)
Calcium: 9.4 mg/dL (ref 8.9–10.3)
Chloride: 105 mmol/L (ref 98–111)
Creatinine, Ser: 2.62 mg/dL — ABNORMAL HIGH (ref 0.44–1.00)
GFR calc Af Amer: 25 mL/min — ABNORMAL LOW (ref >60)
GFR calc non Af Amer: 22 mL/min — ABNORMAL LOW (ref >60)
Glucose, Bld: 130 mg/dL — ABNORMAL HIGH (ref 70–99)
Potassium: 4.2 mmol/L (ref 3.5–5.1)
Sodium: 135 mmol/L (ref 135–145)
Total Bilirubin: 1.9 mg/dL — ABNORMAL HIGH (ref 0.3–1.2)
Total Protein: 6.9 g/dL (ref 6.5–8.1)

## 2020-02-24 LAB — SEDIMENTATION RATE: Sed Rate: 17 mm/hr (ref 0–22)

## 2020-02-24 LAB — BRAIN NATRIURETIC PEPTIDE: B Natriuretic Peptide: 225 pg/mL — ABNORMAL HIGH (ref 0.0–100.0)

## 2020-02-24 LAB — CREATININE, SERUM
Creatinine, Ser: 2.56 mg/dL — ABNORMAL HIGH (ref 0.44–1.00)
GFR calc Af Amer: 26 mL/min — ABNORMAL LOW (ref >60)
GFR calc non Af Amer: 22 mL/min — ABNORMAL LOW (ref >60)

## 2020-02-24 LAB — PROCALCITONIN: Procalcitonin: 7.36 ng/mL

## 2020-02-24 LAB — APTT: aPTT: 31 seconds (ref 24–36)

## 2020-02-24 LAB — PROTIME-INR
INR: 1.2 (ref 0.8–1.2)
Prothrombin Time: 15 seconds (ref 11.4–15.2)

## 2020-02-24 LAB — LACTIC ACID, PLASMA
Lactic Acid, Venous: 1.8 mmol/L (ref 0.5–1.9)
Lactic Acid, Venous: 1.7 mmol/L (ref 0.5–1.9)

## 2020-02-24 LAB — HIV ANTIBODY (ROUTINE TESTING W REFLEX): HIV Screen 4th Generation wRfx: NONREACTIVE

## 2020-02-24 LAB — TSH: TSH: 1.06 u[IU]/mL (ref 0.350–4.500)

## 2020-02-24 MED ORDER — ONDANSETRON HCL 4 MG PO TABS: 4.0000 mg | ORAL_TABLET | Freq: Four times a day (QID) | ORAL | Status: DC | PRN

## 2020-02-24 MED ORDER — HEPARIN SODIUM (PORCINE) 5000 UNIT/ML IJ SOLN
5000.0000 [IU] | Freq: Three times a day (TID) | INTRAMUSCULAR | Status: DC
  Administered 2020-02-24 – 2020-02-25 (×3): 5000 [IU] via SUBCUTANEOUS
  Filled 2020-02-24 (×6): qty 1

## 2020-02-24 MED ORDER — LACTATED RINGERS IV SOLN: INTRAVENOUS | Status: DC

## 2020-02-24 MED ORDER — ACETAMINOPHEN 650 MG RE SUPP: 650.0000 mg | Freq: Four times a day (QID) | RECTAL | Status: DC | PRN

## 2020-02-24 MED ORDER — SODIUM CHLORIDE 0.9 % IV SOLN
2.0000 g | Freq: Once | INTRAVENOUS | Status: AC
  Administered 2020-02-24: 2 g via INTRAVENOUS
  Filled 2020-02-24: qty 2

## 2020-02-24 MED ORDER — METRONIDAZOLE IN NACL 5-0.79 MG/ML-% IV SOLN
500.0000 mg | Freq: Three times a day (TID) | INTRAVENOUS | Status: DC
  Administered 2020-02-24: 500 mg via INTRAVENOUS
  Filled 2020-02-24: qty 100

## 2020-02-24 MED ORDER — LACTATED RINGERS IV BOLUS (SEPSIS)
1000.0000 mL | Freq: Once | INTRAVENOUS | Status: AC
  Administered 2020-02-24: 1000 mL via INTRAVENOUS

## 2020-02-24 MED ORDER — SODIUM CHLORIDE 0.9 % IV SOLN: 2.0000 g | Freq: Once | INTRAVENOUS | Status: DC

## 2020-02-24 MED ORDER — VANCOMYCIN HCL 1750 MG/350ML IV SOLN
1750.0000 mg | INTRAVENOUS | Status: DC
  Filled 2020-02-24: qty 350

## 2020-02-24 MED ORDER — SODIUM CHLORIDE 0.9% FLUSH
3.0000 mL | Freq: Two times a day (BID) | INTRAVENOUS | Status: DC
  Administered 2020-02-24 – 2020-02-27 (×6): 3 mL via INTRAVENOUS

## 2020-02-24 MED ORDER — ALBUTEROL SULFATE (2.5 MG/3ML) 0.083% IN NEBU: 2.5000 mg | INHALATION_SOLUTION | RESPIRATORY_TRACT | Status: DC | PRN

## 2020-02-24 MED ORDER — ACETAMINOPHEN 500 MG PO TABS
1000.0000 mg | ORAL_TABLET | Freq: Once | ORAL | Status: AC
  Administered 2020-02-24: 1000 mg via ORAL
  Filled 2020-02-24: qty 2

## 2020-02-24 MED ORDER — METRONIDAZOLE IN NACL 5-0.79 MG/ML-% IV SOLN
500.0000 mg | Freq: Once | INTRAVENOUS | Status: AC
  Administered 2020-02-24: 500 mg via INTRAVENOUS
  Filled 2020-02-24: qty 100

## 2020-02-24 MED ORDER — SODIUM CHLORIDE 0.9 % IV SOLN: INTRAVENOUS | Status: DC

## 2020-02-24 MED ORDER — SODIUM CHLORIDE 0.9 % IV SOLN
1.0000 g | INTRAVENOUS | Status: DC
  Administered 2020-02-24 – 2020-02-26 (×3): 1 g via INTRAVENOUS
  Filled 2020-02-24 (×4): qty 10

## 2020-02-24 MED ORDER — ONDANSETRON HCL 4 MG/2ML IJ SOLN: 4.0000 mg | Freq: Four times a day (QID) | INTRAMUSCULAR | Status: DC | PRN

## 2020-02-24 MED ORDER — LACTATED RINGERS IV BOLUS
1000.0000 mL | Freq: Once | INTRAVENOUS | Status: AC
  Administered 2020-02-24: 1000 mL via INTRAVENOUS

## 2020-02-24 MED ORDER — GUAIFENESIN-DM 100-10 MG/5ML PO SYRP
5.0000 mL | ORAL_SOLUTION | ORAL | Status: DC | PRN
  Administered 2020-02-24 – 2020-02-25 (×2): 5 mL via ORAL
  Filled 2020-02-24 (×2): qty 5

## 2020-02-24 MED ORDER — ACETAMINOPHEN 325 MG PO TABS
650.0000 mg | ORAL_TABLET | Freq: Four times a day (QID) | ORAL | Status: DC | PRN
  Administered 2020-02-24 – 2020-02-27 (×7): 650 mg via ORAL
  Filled 2020-02-24 (×8): qty 2

## 2020-02-24 MED ORDER — SODIUM CHLORIDE 0.9 % IV SOLN
2.0000 g | Freq: Two times a day (BID) | INTRAVENOUS | Status: DC
  Administered 2020-02-24: 2 g via INTRAVENOUS
  Filled 2020-02-24: qty 2

## 2020-02-24 MED ORDER — VANCOMYCIN HCL IN DEXTROSE 1-5 GM/200ML-% IV SOLN: 1000.0000 mg | Freq: Once | INTRAVENOUS | Status: DC

## 2020-02-24 MED ORDER — VANCOMYCIN HCL 2000 MG/400ML IV SOLN
2000.0000 mg | Freq: Once | INTRAVENOUS | Status: AC
  Administered 2020-02-24: 2000 mg via INTRAVENOUS
  Filled 2020-02-24: qty 400

## 2020-02-24 MED ORDER — ONDANSETRON HCL 4 MG/2ML IJ SOLN
4.0000 mg | Freq: Once | INTRAMUSCULAR | Status: AC
  Administered 2020-02-24: 4 mg via INTRAVENOUS
  Filled 2020-02-24: qty 2

## 2020-02-24 NOTE — Plan of Care (Signed)
  Problem: Education: Goal: Knowledge of General Education information will improve Description: Including pain rating scale, medication(s)/side effects and non-pharmacologic comfort measures Outcome: Progressing   Problem: Activity: Goal: Risk for activity intolerance will decrease Outcome: Progressing   

## 2020-02-24 NOTE — ED Notes (Signed)
Pt lost both IV access at this time. 2 attempts from this RN with [place], no success. IV consult has been ordered.

## 2020-02-24 NOTE — ED Notes (Signed)
Night shift RN reported to this RN that pt was a difficult stick & EDP was aware and stated to begin ABT after the first culture was obtained.

## 2020-02-24 NOTE — Progress Notes (Signed)
RT attempted to retrieve an ABG but was unsuccessful. Pt. Did not tolerate the procedure that well. RN made aware and is at the pt. Bedside.

## 2020-02-24 NOTE — H&P (Signed)
History and Physical    Cynthia Pugh IEP:[id number] DOB: [birth year] DOA: 02/24/2020  PCP: Clide Dales, PA  Patient coming from: home  I have personally briefly reviewed patient's old medical records in [facility]  Chief Complaint: n/v/d and fever  HPI: Cynthia Pugh is a 44 y.o. 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  Patient presents to ed with n/v/d x 3 days with associated fever at home. She states it symptoms started after cook this weekend. She notes no associated abdominal pain, however does note decrease po intake and appetite. She also states she has has increase joint pain. She notes interim history of increase leg swelling for which per nephrologist has placed her on standing lasix. She states she has note started this regimen as of yet.  She denies sob, or orthopnea, or chest pain ,dysuria,flank pain or urinary frequency. She denies any sick contact.  ED Course:  Temp 102.6, BP:147/67, hr 105, rr32 sat 100% on ra  YSA:YTKZS tach no st twave changes  CXR:  1. Increased vascular congestion without overt edema. 2. No acute airspace disease.  Labs:  Lactate 1.8 Na:135, k 4.2 , bicarb 20, cr 2.62 Ast:44, alt46,TB1.9 Wbc 19.8, pmn79, plt122 Covid;pending WF:UXNATFT >300,RBC>50, wbc 21-50  Tx: LR x1 , cefepime,metronidazole,vanc  Review of Systems: As per HPI otherwise 10 point review of systems negative.   Past Medical History:  Diagnosis Date  . Anemia   . ANEMIA IN CHRONIC KIDNEY DISEASE 06/14/2006   Qualifier: Diagnosis of  By: Renae Fickle MD, Bhakti    . ANEMIA, IRON DEFICIENCY, MICROCYTIC 08/21/2006   Annotation: sp hosp admission for 2units PRBC 10/06 Qualifier: Diagnosis of  By: Renae Fickle MD, Bhakti    . Arteriovenous fistula, acquired (HCC)   . Deceased-donor kidney transplant recipient   .  End stage renal disease (HCC) 01/20/2012  . Essential hypertension 03/17/2019  . GERD (gastroesophageal reflux disease)   . History of blood transfusion   . Hypertension   . Hypomagnesemia   . Lupus (HCC)   . 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  Henderson [place]: Diagnosis of  By: Renae Fickle MD, Bhakti    . Palpitations 03/17/2019  . Renal disorder   . Secondary hyperparathyroidism, renal (HCC)   . 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 ABDOMINAL HYSTERECTOMY    . AV FISTULA PLACEMENT  08/25/2010  . DILATION AND CURETTAGE OF UTERUS     x2  . INSERTION OF DIALYSIS CATHETER  05/23/2012   Procedure: INSERTION OF DIALYSIS CATHETER;  Surgeon: Fransisco Hertz, MD;  Location: [facility] OR;  Service: Vascular;  Laterality: Right;  insertion of dialysis catheter  . TRANSPLANTATION RENAL       reports that she has never smoked. She has never used smokeless tobacco. She reports that she does not drink alcohol and does not use drugs.  Allergies  Allergen Reactions  . Aspirin Other (See Comments)    Childhood seizures    Family History  Adopted: Yes  Problem Relation Age of Onset  . Nephritis Sister        LUPUS    Prior to Admission medications  Medication Sig Start Date End Date Taking? Authorizing Provider  furosemide (LASIX) 40 MG tablet Take 40 mg by mouth daily as needed for fluid or edema.  02/19/20  Yes [provider]  metoprolol tartrate (LOPRESSOR) 25 MG tablet Take 37.5 mg by mouth 2 (two) times daily.   Yes [provider]  mycophenolate (MYFORTIC) 180 MG EC tablet Take 540 mg by mouth 2 (two) times daily.    Yes [provider]  omeprazole (PRILOSEC) 20 MG capsule Take 20 mg by mouth daily.   Yes [provider]  predniSONE  (DELTASONE) 5 MG tablet Take 5 mg by mouth daily with breakfast.   Yes [provider]  sulfamethoxazole-trimethoprim (BACTRIM) 400-80 MG tablet Take 1 tablet by mouth every Monday, Wednesday, and Friday. 12/01/17  Yes [provider]  tacrolimus (PROGRAF) 0.5 MG capsule Take 0.5 mg by mouth 2 (two) times daily. 01/21/20  Yes [provider]    Physical Exam: Vitals:   02/24/20 0229 02/24/20 0230 02/24/20 0436 02/24/20 0459  BP: (!) 147/67  98/65 103/83  Pulse: (!) 105  (!) 105   Resp: (!) 32  18 (!) 28  Temp: (!) 102.6 F (39.2 C)  (!) 100.7 F (38.2 C)   TempSrc: Oral  Oral   SpO2: 100%  100%   Weight:  (!) 149.7 kg    Height:  5\' 6"  (1.676 m)      Constitutional: NAD, calm, comfortable Vitals:   02/24/20 0229 02/24/20 0230 02/24/20 0436 02/24/20 0459  BP: (!) 147/67  98/65 103/83  Pulse: (!) 105  (!) 105   Resp: (!) 32  18 (!) 28  Temp: (!) 102.6 F (39.2 C)  (!) 100.7 F (38.2 C)   TempSrc: Oral  Oral   SpO2: 100%  100%   Weight:  (!) 149.7 kg    Height:  5\' 6"  (1.676 m)     Eyes: PERRL, lids and conjunctivae normal ENMT: Mucous membranes are moist. Posterior pharynx clear of any exudate or lesions.Normal dentition.  Neck: normal, supple, no masses, no thyromegaly Respiratory: clear to auscultation bilaterally, no wheezing, no crackles. Normal respiratory effort. No accessory muscle use.  Cardiovascular: Regular rate and rhythm, no murmurs / rubs / gallops. No extremity edema. 2+ pedal pulses. No carotid bruits.  Abdomen: no tenderness, no masses palpated. No hepatosplenomegaly. Bowel sounds positive. obese Musculoskeletal: no clubbing / cyanosis. No joint deformity upper and lower extremities. Good ROM, no contractures. Normal muscle tone.  Skin: no rashes, lesions, ulcers. No induration Neurologic: CN 2-12 grossly intact. Sensation intact, DTR normal. Strength 5/5 in all 4.  Psychiatric: Normal judgment and insight. Alert and oriented x 3.  Normal mood.    Labs on Admission: I have personally reviewed following labs and imaging studies  CBC: Recent Labs  Lab 02/24/20 0313  WBC 19.8*  NEUTROABS 15.6*  HGB 14.7  HCT 46.9*  MCV 100.6*  PLT 122*   Basic Metabolic Panel: Recent Labs  Lab 02/24/20 0313  NA 135  K 4.2  CL 105  CO2 20*  GLUCOSE 130*  BUN 21*  CREATININE 2.62*  CALCIUM 9.4   GFR: Estimated Creatinine Clearance: 42.2 mL/min (A) (by C-G formula based on SCr of 2.62 mg/dL (H)). Liver Function Tests: Recent Labs  Lab 02/24/20 0313  AST 44*  ALT 46*  ALKPHOS 49  BILITOT 1.9*  PROT 6.9  ALBUMIN 3.8   No results for input(s): LIPASE, AMYLASE in the last 168 hours. No results for  input(s): AMMONIA in the last 168 hours. Coagulation Profile: Recent Labs  Lab 02/24/20 0313  INR 1.2   Cardiac Enzymes: No results for input(s): CKTOTAL, CKMB, CKMBINDEX, TROPONINI in the last 168 hours. BNP (last 3 results) No results for input(s): PROBNP in the last 8760 hours. HbA1C: No results for input(s): HGBA1C in the last 72 hours. CBG: No results for input(s): GLUCAP in the last 168 hours. Lipid Profile: No results for input(s): CHOL, HDL, LDLCALC, TRIG, CHOLHDL, LDLDIRECT in the last 72 hours. Thyroid Function Tests: No results for input(s): TSH, T4TOTAL, FREET4, T3FREE, THYROIDAB in the last 72 hours. Anemia Panel: No results for input(s): VITAMINB12, FOLATE, FERRITIN, TIBC, IRON, RETICCTPCT in the last 72 hours. Urine analysis:    Component Value Date/Time   COLORURINE AMBER (A) 02/24/2020 0401   APPEARANCEUR CLOUDY (A) 02/24/2020 0401   LABSPEC 1.019 02/24/2020 0401   PHURINE 5.0 02/24/2020 0401   GLUCOSEU NEGATIVE 02/24/2020 0401   HGBUR LARGE (A) 02/24/2020 0401   BILIRUBINUR NEGATIVE 02/24/2020 0401   KETONESUR NEGATIVE 02/24/2020 0401   PROTEINUR >=300 (A) 02/24/2020 0401   NITRITE NEGATIVE 02/24/2020 0401   LEUKOCYTESUR MODERATE (A) 02/24/2020 0401    Radiological Exams on  Admission: DG Chest Port 1 View  Result Date: 02/24/2020 CLINICAL DATA:  Fever, cough, history of renal transplant EXAM: PORTABLE CHEST 1 VIEW COMPARISON:  01/22/2020 FINDINGS: Single frontal view of the chest was obtained, limited by body habitus, portable technique, and patient positioning. Cardiac silhouette is stable. There is increased central vascular congestion without focal consolidation, effusion, or pneumothorax. No acute bony abnormalities. IMPRESSION: 1. Increased vascular congestion without overt edema. 2. No acute airspace disease. Electronically Signed   By: Sharlet Salina M.D.   On: 02/24/2020 02:57    EKG: Independently reviewed. See above    Assessment/Plan   Sepsis -presumed source uti /but possible gi source as well -goal directed fluid given in ed  -continue broad spectrum antibiotics deescalated as able  - renal u/s to evaluate for pyelo/abscess given patient immune compromise and hx of renal transplant   Gi complaints  -n/v/d  -check stool pcr  -supportive care   Fluid overload  -check bnp  - stable bp and hr s/p goal directed tx , gently hydration for now -echo for further evaluation  Hx of SLE nephritis s/p renal transplant now with AKI  -due to infection/med side-effect /volume status -last cr insystem 2015 -patient has renal transplant 3 years ago  -hold nephrotoxic medications -check tacrolimus level -renal consult for further assistant /u/s as mentioned above  Elevated lfts  - most likely related to sepsis  -continue to trend lfts  -if continued elevation consider further evaluation   GERD -ppi  HTN -borderline elevation in ed  -continue lopressor  Anemia -stable   history of COVID infection 06/2019  -s/p recovery now with continued chronic cough.   -fully vaccinated     DVT prophylaxis:heparin Code Status: Full Family Communication: N/A  Disposition Plan: 3-4 days Consults called: Renal [place]  Admission status:SDU     Lurline Del MD [facility]  If 7PM-7AM, please contact night-coverage [email or web address] Password [facility]  02/24/2020, 5:24 AM

## 2020-02-24 NOTE — ED Notes (Signed)
Pt is a hard stick, this RN was able to get one US guided IV in the upper left arm, blood drew back about 5 mL then quit. This blood was sent to lab in a lavender, light green & yellow tube. Lab called & this RN relayed to run as many labs off of what was sent as possible, or as labeled, whichever results the most.

## 2020-02-24 NOTE — Progress Notes (Signed)
New Admission Note: ? Arrival Method: via stretcher Mental Orientation: A/O x 4  Telemetry: Box #21 Assessment: Completed Skin: Refer to flowsheet IV: Left FA Pain: 0/10 Tubes: Safety Measures: Safety Fall Prevention Plan discussed with patient. Admission: Completed 5 [place] Orientation: Patient has been orientated to the room, unit and the staff.  Orders have been reviewed and are being implemented. Will continue to monitor the patient. Call light has been placed within reach and bed alarm has been activated.  ? [employer], RN [phone]

## 2020-02-24 NOTE — ED Notes (Signed)
Reported to nurse will RN. THAT IT WAS UNABLE TO GET BLOOD

## 2020-02-24 NOTE — ED Notes (Signed)
Only able to acquire 1 set of blood cultures before antibiotics. MD Ward made aware.

## 2020-02-24 NOTE — ED Notes (Signed)
Patient transported to Ultrasound 

## 2020-02-24 NOTE — Progress Notes (Signed)
PROGRESS NOTE    Cynthia Pugh  KXF:[id number] DOB: 01-10-1976 DOA: 02/24/2020 PCP: Fortino Sic, PA    Brief Narrative:  Patient was admitted to the hospital with a working diagnosis of sepsis due to urinary tract infection.  44 year old female with significant past medical history for systemic lupus erythematous, lupus nephritis status post kidney transplant, GERD, hypertension, and anemia.  She had COVID-18 July 2019.  She reported 3 days of nausea, vomiting, and diarrhea.  Associated with fever.  On her initial physical examination temperature 102.6, blood pressure 147/67, heart rate 105, respiratory rate 32, oxygen saturation 100% her lungs are clear to auscultation bilaterally, heart S1-S2, present rhythmic, her abdomen was soft nontender, no lower extremity edema. Sodium 135, potassium 4.2, chloride 105, bicarb 20, glucose 130, BUN 21, creatinine 2.62, white count 19.8, hemoglobin 14.7, hematocrit 46.9, platelets 122.  SARS COVID-19 negative.  Urinalysis 21-50 white cells, more than 50 red cells, specific gravity 1.019.  Protein more than 300.  Chest radiograph hypoinflated with congestive hilar vasculature.  EKG 106 bpm, normal axis, normal intervals, sinus rhythm, no ST segment or T wave changes.   Assessment & Plan:   Principal Problem:   Sepsis secondary to UTI [facility]) Active Problems:   Essential hypertension   Lupus ([place])   Lupus nephritis ([place])   Renal transplant recipient   Anemia   1. Sepsis due to urine infection, (present on admission)/ end  Organ damage AKI. Patient continue to have leukocytosis up to 15.8, pending result of blood and urine culture. [place] transplanted kidney with no hydronephrosis or obstructive uropathy.   Continue antibiotic therapy IV ceftriaxone, will hold on IV vancomycin and cefepime. Gentle hydration with isotonic saline. Follow up cell count, temperature curve and cultures.   2. Lupus/ lupus nephritis sp renal transplant/ AKI on CKD  stage 2. No signs of lupus flare. Renal function with serum cr at 2.5 to 2,6, her base cr is 1,1 to 1,2.   Will continue hydration with balanced electrolyte solutions and follow renal panel in am, avoid hypotension and nephrotoxic medications,   3. Chronic anemia. Hgb stable.  4. Obesity class 3. BMI calculated more than 40, will need outpatient follow up.   Patient continue to be at high risk for worsening sepsis  Status is: Inpatient  Remains inpatient appropriate because:IV treatments appropriate due to intensity of illness or inability to take PO   Dispo: The patient is from: Home              Anticipated d/c is to: Home              Anticipated d/c date is: 2 days              Patient currently is not medically stable to d/c.   DVT prophylaxis: Enoxaparin   Code Status:   full  Family Communication:  No family at the bedside       Antimicrobials:   Dc vanc/ cefepime/ metronidazole  Start ceftriaxone     Subjective: Patient continue to be very weak and deconditioned, no nausea or vomiting, at home had urinary incontinence.   Objective: Vitals:   02/24/20 1230 02/24/20 1353 02/24/20 1400 02/24/20 1508  BP: 110/71  107/70 117/78  Pulse:    (!) 103  Resp: (!) 32  18 13  Temp:  99.6 F (37.6 C)    TempSrc:  Oral    SpO2:    100%  Weight:      Height:  No intake or output data in the 24 hours ending 02/24/20 1638 Filed Weights   02/24/20 0230  Weight: (!) 149.7 kg    Examination:   General: deconditioned and ill looking appearing  Neurology: Awake and alert, non focal  E ENT: mild pallor, no icterus, oral mucosa moist Cardiovascular: No JVD. S1-S2 present, rhythmic, no gallops, rubs, or murmurs. No lower extremity edema. Pulmonary: positive breath sounds bilaterally, adequate air movement, no wheezing, rhonchi or rales. Gastrointestinal. Abdomen protuberant with no organomegaly, non tender, no rebound or guarding Skin. No rashes Musculoskeletal:  no joint deformities     Data Reviewed: I have personally reviewed following labs and imaging studies  CBC: Recent Labs  Lab 02/24/20 0313 02/24/20 0640 02/24/20 0921  WBC 19.8*  --  15.8*  NEUTROABS 15.6*  --   --   HGB 14.7 14.3 13.2  HCT 46.9* 42.0 42.7  MCV 100.6*  --  104.9*  PLT 122*  --  66*   Basic Metabolic Panel: Recent Labs  Lab 02/24/20 0313 02/24/20 0640 02/24/20 0921  NA 135 138  --   K 4.2 4.2  --   CL 105  --   --   CO2 20*  --   --   GLUCOSE 130*  --   --   BUN 21*  --   --   CREATININE 2.62*  --  2.56*  CALCIUM 9.4  --   --    GFR: Estimated Creatinine Clearance: 43.2 mL/min (A) (by C-G formula based on SCr of 2.56 mg/dL (H)). Liver Function Tests: Recent Labs  Lab 02/24/20 0313  AST 44*  ALT 46*  ALKPHOS 49  BILITOT 1.9*  PROT 6.9  ALBUMIN 3.8   No results for input(s): LIPASE, AMYLASE in the last 168 hours. No results for input(s): AMMONIA in the last 168 hours. Coagulation Profile: Recent Labs  Lab 02/24/20 0313  INR 1.2   Cardiac Enzymes: No results for input(s): CKTOTAL, CKMB, CKMBINDEX, TROPONINI in the last 168 hours. BNP (last 3 results) No results for input(s): PROBNP in the last 8760 hours. HbA1C: No results for input(s): HGBA1C in the last 72 hours. CBG: No results for input(s): GLUCAP in the last 168 hours. Lipid Profile: No results for input(s): CHOL, HDL, LDLCALC, TRIG, CHOLHDL, LDLDIRECT in the last 72 hours. Thyroid Function Tests: No results for input(s): TSH, T4TOTAL, FREET4, T3FREE, THYROIDAB in the last 72 hours. Anemia Panel: No results for input(s): VITAMINB12, FOLATE, FERRITIN, TIBC, IRON, RETICCTPCT in the last 72 hours.    Radiology Studies: I have reviewed all of the imaging during this hospital visit personally     Scheduled Meds: . heparin  5,000 Units Subcutaneous Q8H  . sodium chloride flush  3 mL Intravenous Q12H   Continuous Infusions: . sodium chloride 75 mL/hr at 02/24/20 0900  .  ceFEPime (MAXIPIME) IV 2 g (02/24/20 1527)  . metronidazole Stopped (02/24/20 1329)  . vancomycin       LOS: 0 days        Cynthia Comunale Annett Gula, MD

## 2020-02-24 NOTE — ED Triage Notes (Signed)
Pt arrives from home c/o cough since November. Pt c/o of n/v/d Saturday night but not nausea at this time. MD at bedside. Pt is 3 years post kidney transplant. Temp oral is 102.6 at this time

## 2020-02-24 NOTE — ED Notes (Signed)
Pt S/O at bedside 

## 2020-02-24 NOTE — ED Provider Notes (Signed)
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  Reports she had COVID-19 in November and has had a chronic cough since that time.  Denies chest pain or shortness of breath.  She denies neck pain or neck stiffness, sore throat, ear pain, abdominal pain, dysuria, hematuria, vaginal bleeding or discharge, tick bites, rash, recent travel or sick contact.  ROS: See HPI Constitutional: fever  Eyes: no drainage  ENT: no runny nose   Cardiovascular:  no chest pain  Resp: no SOB  GI:  vomiting and diarrhea GU: no dysuria Integumentary: no rash  Allergy: no hives  Musculoskeletal: no leg swelling  Neurological: no slurred speech ROS otherwise negative  PAST MEDICAL HISTORY/PAST SURGICAL HISTORY:  Past Medical History:  Diagnosis Date  . Anemia   . ANEMIA IN CHRONIC KIDNEY DISEASE 06/14/2006   Qualifier: Diagnosis of  By: Renae Fickle MD, Bhakti    . ANEMIA, IRON DEFICIENCY, MICROCYTIC 08/21/2006   Annotation: sp hosp admission for 2units PRBC 10/06 Qualifier: Diagnosis of  By: Renae Fickle MD, Bhakti    . Arteriovenous fistula, acquired (HCC)   . Deceased-donor kidney transplant recipient   . End stage renal disease (HCC) 01/20/2012  . Essential hypertension 03/17/2019  . GERD (gastroesophageal reflux disease)   . History of blood transfusion   . Hypertension   . Hypomagnesemia   . Lupus (HCC)   . 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  Henderson [place]: Diagnosis of  By: Renae Fickle MD, Bhakti    . Palpitations 03/17/2019  . Renal disorder   . Secondary hyperparathyroidism, renal (HCC)   . 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 Authorizing Provider  ergocalciferol (VITAMIN D2) 1.25 MG (50000 UT) capsule Take 50,000 Units by mouth once a week.    [provider]  furosemide (LASIX) 20 MG tablet Take 20 mg by mouth daily as needed.    [provider]  metoprolol tartrate (LOPRESSOR) 25 MG tablet Take 37.5 mg by mouth 2 (two) times daily.    [provider]  mycophenolate (MYFORTIC) 180 MG EC tablet 180 mg. TAKE 3 TABLETS BY MOUTH TWO TIMES A DAY    [provider]  omeprazole (PRILOSEC) 20 MG capsule Take 20 mg by mouth daily.    [provider]  predniSONE (DELTASONE) 5 MG tablet Take 5 mg by mouth daily with breakfast.    [provider]  PROGRAF 1 MG capsule Take 1 tablet by mouth 2 (two) times daily. 04/25/19   [provider]  sulfamethoxazole-trimethoprim (BACTRIM) 400-80 MG tablet Take 1 tablet by mouth every Monday, Wednesday, and Friday. 12/01/17   [provider]    ALLERGIES:  Allergies  Allergen Reactions  . Aspirin Other (See Comments)    Childhood seizures    SOCIAL HISTORY:  Social History   Tobacco Use  . Smoking status: Never Smoker  . Smokeless tobacco: Never Used  Substance Use Topics  . Alcohol use: No    FAMILY HISTORY: Family History  Adopted: Yes  Problem Relation Age of Onset  . Nephritis Sister        LUPUS    EXAM: BP 103/83   Pulse (!) 105   Temp (!) 100.7 F (38.2 C) (Oral)   Resp (!) 28   Ht 5\' 6"  (1.676 m)   Wt (!) 149.7 kg   SpO2 100%   BMI 53.26 kg/m  CONSTITUTIONAL: Alert and oriented and responds appropriately to questions. Well-appearing; well-nourished, febrile but nontoxic, obese HEAD: Normocephalic EYES: Conjunctivae clear, pupils appear equal, EOM appear intact ENT: normal nose; moist mucous membranes NECK: Supple, normal  ROM CARD: Regular and tachycardic, S1 and S2 appreciated; no murmurs, no clicks, no rubs, no gallops RESP: Mildly tachypneic,; breath sounds clear and equal bilaterally; no wheezes, no rhonchi, no rales, no hypoxia or respiratory distress, speaking full sentences ABD/GI: Normal bowel sounds; non-distended; soft, non-tender, no rebound, no guarding, no peritoneal signs, no hepatosplenomegaly BACK:  The back appears normal EXT: Normal ROM in all joints; no deformity noted, no edema; no cyanosis SKIN: Normal color for age and race; warm; no rash on exposed skin NEURO: Moves all extremities equally PSYCH: The patient's mood and manner are appropriate.   MEDICAL DECISION MAKING: Patient here with fever, tachycardia, tachypnea.  She is immunosuppressed after renal transplant.  Will obtain septic work-up with labs, urine, cultures, Covid swab, chest x-ray.  Will give IV fluids and broad-spectrum antibiotics.  She is not hypotensive at this time.  ED PROGRESS: Patient's lactate is normal.  She has a white blood cell count of 19 with left shift.  Urine appears infected and likely the source.  Chest x-ray clear.  We will continue to hydrate patient.  Will discuss with hospitalist for admission.   5:26 AM Discussed patient's case with hospitalist, Dr. Marcello Moores.  I have recommended admission and patient (and family if present) agree with this plan. Admitting physician will place admission orders.   I reviewed all nursing notes, vitals, pertinent previous records and reviewed/interpreted all EKGs, lab and urine results, imaging (as available).     EKG Interpretation  Date/Time:  Monday February 24 2020 02:34:24 EDT Ventricular Rate:  106 PR Interval:    QRS Duration: 86 QT Interval:  323 QTC Calculation: 429 R Axis:   -18 Text Interpretation: Sinus tachycardia Borderline left axis deviation Low voltage, precordial leads Confirmed by Tippi Mccrae, Cyril Mourning [phone]) on 02/24/2020 3:06:26 AM       CRITICAL  CARE Performed by: Cyril Mourning [facility]   Total critical care time: 55 minutes  Critical care time was exclusive of separately billable procedures and treating other patients.  Critical care was necessary to treat or prevent imminent or life-threatening deterioration.  Critical care was time spent personally by me on the following activities: development of treatment plan with patient and/or surrogate as well as nursing, discussions with consultants, evaluation of patient's response to treatment, examination of patient, obtaining history from patient or surrogate, ordering and performing treatments and interventions, ordering and review of laboratory studies, ordering and review of radiographic studies, pulse oximetry and re-evaluation of patient's condition.   DAELYN PETTAWAY was evaluated in Emergency Department on 02/24/2020 for the symptoms described in the history of present illness. She was evaluated in the context of the global COVID-19 pandemic, which necessitated consideration that the patient might be at risk for infection with the SARS-CoV-2 virus that causes COVID-19. Institutional protocols and algorithms that pertain to the evaluation of patients at risk for COVID-19 are in a state of rapid change based  on information released by regulatory bodies including the CDC and federal and state organizations. These policies and algorithms were followed during the patient's care in the ED.      Sharrod Achille, Layla Maw, DO 02/24/20 [phone]

## 2020-02-24 NOTE — ED Notes (Signed)
No response from Loney Loh, MD, paged sent to [facility],MD awaiting provider response to vital orders to transport pt to bed assignment, charge made aware.

## 2020-02-24 NOTE — Progress Notes (Signed)
Pharmacy Antibiotic Note  Cynthia Pugh is a 44 y.o. female admitted on 02/24/2020 with sepsis.  Pharmacy has been consulted for vancomycin and cefepime dosing.  Plan: Cefepime 2gm IV q12 hours Vancomycin 2gm IV x 1 then 1750 mg IV q24 hours F/u renal function, cultures and clinical course  Height: 5\' 6"  (167.6 cm) Weight: (!) 149.7 kg (330 lb) IBW/kg (Calculated) : 59.3  Temp (24hrs), Avg:101.7 F (38.7 C), Min:100.7 F (38.2 C), Max:102.6 F (39.2 C)  Recent Labs  Lab 02/24/20 0300 02/24/20 0313  WBC  --  19.8*  CREATININE  --  2.62*  LATICACIDVEN 1.8  --     Estimated Creatinine Clearance: 42.2 mL/min (A) (by C-G formula based on SCr of 2.62 mg/dL (H)).    Allergies  Allergen Reactions  . Aspirin Other (See Comments)    Childhood seizures     Thank you for allowing pharmacy to be a part of this patient's care.  02/26/20 [place] 02/24/2020 4:49 AM

## 2020-02-24 NOTE — ED Notes (Signed)
ABG unsuccessful. MD notified, VBG ordered

## 2020-02-24 NOTE — ED Notes (Addendum)
Pt report called to [place], RN stated pt cannot come to floor with existing vital orders, [facility]  paged, charge made aware at this time.

## 2020-02-25 ENCOUNTER — Inpatient Hospital Stay (HOSPITAL_COMMUNITY): Payer: Medicare Other

## 2020-02-25 LAB — COMPREHENSIVE METABOLIC PANEL
ALT: 33 U/L (ref 0–44)
AST: 36 U/L (ref 15–41)
Albumin: 3.3 g/dL — ABNORMAL LOW (ref 3.5–5.0)
Alkaline Phosphatase: 47 U/L (ref 38–126)
Anion gap: 8 (ref 5–15)
BUN: 24 mg/dL — ABNORMAL HIGH (ref 6–20)
CO2: 22 mmol/L (ref 22–32)
Calcium: 9.3 mg/dL (ref 8.9–10.3)
Chloride: 108 mmol/L (ref 98–111)
Creatinine, Ser: 2.29 mg/dL — ABNORMAL HIGH (ref 0.44–1.00)
GFR calc Af Amer: 30 mL/min — ABNORMAL LOW (ref >60)
GFR calc non Af Amer: 25 mL/min — ABNORMAL LOW (ref >60)
Glucose, Bld: 138 mg/dL — ABNORMAL HIGH (ref 70–99)
Potassium: 4.6 mmol/L (ref 3.5–5.1)
Sodium: 138 mmol/L (ref 135–145)
Total Bilirubin: 0.9 mg/dL (ref 0.3–1.2)
Total Protein: 6.2 g/dL — ABNORMAL LOW (ref 6.5–8.1)

## 2020-02-25 LAB — CBC
HCT: 43.7 % (ref 36.0–46.0)
Hemoglobin: 14.4 g/dL (ref 12.0–15.0)
MCH: 32.5 pg (ref 26.0–34.0)
MCHC: 33 g/dL (ref 30.0–36.0)
MCV: 98.6 fL (ref 80.0–100.0)
Platelets: 91 10*3/uL — ABNORMAL LOW (ref 150–400)
RBC: 4.43 MIL/uL (ref 3.87–5.11)
RDW: 12.5 % (ref 11.5–15.5)
WBC: 14.7 10*3/uL — ABNORMAL HIGH (ref 4.0–10.5)
nRBC: 0 % (ref 0.0–0.2)

## 2020-02-25 LAB — URINE CULTURE

## 2020-02-25 LAB — C DIFFICILE QUICK SCREEN W PCR REFLEX
C Diff antigen: NEGATIVE
C Diff interpretation: NOT DETECTED
C Diff toxin: NEGATIVE

## 2020-02-25 MED ORDER — DM-GUAIFENESIN ER 30-600 MG PO TB12
1.0000 | ORAL_TABLET | Freq: Two times a day (BID) | ORAL | Status: DC
  Administered 2020-02-25 – 2020-02-27 (×5): 1 via ORAL
  Filled 2020-02-25 (×5): qty 1

## 2020-02-25 MED ORDER — MYCOPHENOLATE SODIUM 180 MG PO TBEC
540.0000 mg | DELAYED_RELEASE_TABLET | Freq: Two times a day (BID) | ORAL | Status: DC
  Administered 2020-02-25 – 2020-02-27 (×5): 540 mg via ORAL
  Filled 2020-02-25 (×6): qty 3

## 2020-02-25 MED ORDER — PREDNISONE 5 MG PO TABS
5.0000 mg | ORAL_TABLET | Freq: Every day | ORAL | Status: DC
  Administered 2020-02-25 – 2020-02-27 (×3): 5 mg via ORAL
  Filled 2020-02-25 (×3): qty 1

## 2020-02-25 MED ORDER — AZITHROMYCIN 500 MG PO TABS
500.0000 mg | ORAL_TABLET | Freq: Every day | ORAL | Status: DC
  Administered 2020-02-25 – 2020-02-27 (×3): 500 mg via ORAL
  Filled 2020-02-25 (×3): qty 1

## 2020-02-25 MED ORDER — SACCHAROMYCES BOULARDII 250 MG PO CAPS
250.0000 mg | ORAL_CAPSULE | Freq: Two times a day (BID) | ORAL | Status: DC
  Administered 2020-02-25 – 2020-02-27 (×6): 250 mg via ORAL
  Filled 2020-02-25 (×6): qty 1

## 2020-02-25 MED ORDER — TACROLIMUS 0.5 MG PO CAPS
0.5000 mg | ORAL_CAPSULE | Freq: Two times a day (BID) | ORAL | Status: DC
  Administered 2020-02-25 – 2020-02-27 (×5): 0.5 mg via ORAL
  Filled 2020-02-25 (×6): qty 1

## 2020-02-25 MED ORDER — BENZONATATE 100 MG PO CAPS
100.0000 mg | ORAL_CAPSULE | Freq: Three times a day (TID) | ORAL | Status: DC
  Administered 2020-02-25 – 2020-02-27 (×6): 100 mg via ORAL
  Filled 2020-02-25 (×6): qty 1

## 2020-02-25 NOTE — Progress Notes (Signed)
PROGRESS NOTE    Cynthia Pugh  NKN:[id number] DOB: 02/04/1976 DOA: 02/24/2020 PCP: Clide Dales, PA    Brief Narrative:  Patient was admitted to the hospital with a working diagnosis of sepsis due to urinary tract infection.  44 year old female with significant past medical history for systemic lupus erythematous, lupus nephritis status post kidney transplant, GERD, hypertension, and anemia.  She had COVID-18 July 2019.  She reported 3 days of nausea, vomiting, and diarrhea.  Associated with fever.  On her initial physical examination temperature 102.6, blood pressure 147/67, heart rate 105, respiratory rate 32, oxygen saturation 100% her lungs are clear to auscultation bilaterally, heart S1-S2, present rhythmic, her abdomen was soft nontender, no lower extremity edema. Sodium 135, potassium 4.2, chloride 105, bicarb 20, glucose 130, BUN 21, creatinine 2.62, white count 19.8, hemoglobin 14.7, hematocrit 46.9, platelets 122.  SARS COVID-19 negative.  Urinalysis 21-50 white cells, more than 50 red cells, specific gravity 1.019.  Protein more than 300.  Chest radiograph hypoinflated with congestive hilar vasculature.  EKG 106 bpm, normal axis, normal intervals, sinus rhythm, no ST segment or T wave changes.   Assessment & Plan:   Principal Problem:   Sepsis secondary to UTI [facility]) Active Problems:   Essential hypertension   Sepsis (HCC)   Lupus (HCC)   Lupus nephritis (HCC)   Renal transplant recipient   Anemia   1. Sepsis due to urine infection, (present on admission)/ end  Organ damage AKI. Patient continue to have leukocytosis, pending result of blood and urine culture. [place] transplanted kidney with no hydronephrosis or obstructive uropathy.   Continue antibiotic therapy IV ceftriaxone. Gentle hydration with isotonic saline. Follow up cell count, temperature curve and cultures.   2. Lupus/ lupus nephritis sp renal transplant/ AKI on CKD stage 2. No signs of lupus flare.  Renal function with serum cr at 2.5 to 2,6, her base cr is 1,1 to 1,2.   Will continue hydration with balanced electrolyte solutions and follow renal panel in am, avoid hypotension and nephrotoxic medications. May require nephrology consultation if there is no improvement in renal function.  3. Chronic anemia. Hgb stable.  4. Obesity class 3. BMI calculated more than 40, will need outpatient follow up.   5.  Diarrhea. Patient reports 6-7 bowel movements yesterday and 1 loose BM today. Orders GI pathogen panel as well as C. difficile.  Status is: Inpatient  Remains inpatient appropriate because:IV treatments appropriate due to intensity of illness or inability to take PO   Dispo: The patient is from: Home              Anticipated d/c is to: Home              Anticipated d/c date is: 2 days              Patient currently is not medically stable to d/c.   DVT prophylaxis: Enoxaparin   Code Status:   full  Family Communication:  No family at the bedside       Antimicrobials:   Dc vanc/ cefepime/ metronidazole  Start ceftriaxone     Subjective: Remains fatigue and tired.  Reports diarrhea.  No fever no chills.  Also reports nausea no vomiting.  Objective: Vitals:   02/25/20 0447 02/25/20 0845 02/25/20 1254 02/25/20 1715  BP: 109/75 122/82 (!) 109/55 110/62  Pulse: 97 (!) 110 77 95  Resp: 16 18 18 18   Temp: 99.7 F (37.6 C) 99.3 F (37.4 C) 97.8 F (  36.6 C) 98.6 F (37 C)  TempSrc: Oral Oral Oral Oral  SpO2: 96% 96% 100% 95%  Weight:      Height:        Intake/Output Summary (Last 24 hours) at 02/25/2020 1909 Last data filed at 02/25/2020 1900 Gross per 24 hour  Intake 820 ml  Output 0 ml  Net 820 ml   Filed Weights   02/24/20 0230 02/24/20 2317  Weight: (!) 149.7 kg (!) 147.7 kg    Examination:   General: deconditioned and ill looking appearing  Neurology: Awake and alert, non focal  E ENT: mild pallor, no icterus, oral mucosa moist Cardiovascular:  No JVD. S1-S2 present, rhythmic, no gallops, rubs, or murmurs. No lower extremity edema. Pulmonary: positive breath sounds bilaterally, adequate air movement, no wheezing, rhonchi or rales. Gastrointestinal. Abdomen protuberant with no organomegaly, non tender, no rebound or guarding Skin. No rashes Musculoskeletal: no joint deformities     Data Reviewed: I have personally reviewed following labs and imaging studies  CBC: Recent Labs  Lab 02/24/20 0313 02/24/20 0640 02/24/20 0921 02/25/20 0617  WBC 19.8*  --  15.8* 14.7*  NEUTROABS 15.6*  --   --   --   HGB 14.7 14.3 13.2 14.4  HCT 46.9* 42.0 42.7 43.7  MCV 100.6*  --  104.9* 98.6  PLT 122*  --  66* 91*   Basic Metabolic Panel: Recent Labs  Lab 02/24/20 0313 02/24/20 0640 02/24/20 0921 02/25/20 0833  NA 135 138  --  138  K 4.2 4.2  --  4.6  CL 105  --   --  108  CO2 20*  --   --  22  GLUCOSE 130*  --   --  138*  BUN 21*  --   --  24*  CREATININE 2.62*  --  2.56* 2.29*  CALCIUM 9.4  --   --  9.3   GFR: Estimated Creatinine Clearance: 47.8 mL/min (A) (by C-G formula based on SCr of 2.29 mg/dL (H)). Liver Function Tests: Recent Labs  Lab 02/24/20 0313 02/25/20 0833  AST 44* 36  ALT 46* 33  ALKPHOS 49 47  BILITOT 1.9* 0.9  PROT 6.9 6.2*  ALBUMIN 3.8 3.3*   No results for input(s): LIPASE, AMYLASE in the last 168 hours. No results for input(s): AMMONIA in the last 168 hours. Coagulation Profile: Recent Labs  Lab 02/24/20 0313  INR 1.2   Cardiac Enzymes: No results for input(s): CKTOTAL, CKMB, CKMBINDEX, TROPONINI in the last 168 hours. BNP (last 3 results) No results for input(s): PROBNP in the last 8760 hours. HbA1C: No results for input(s): HGBA1C in the last 72 hours. CBG: No results for input(s): GLUCAP in the last 168 hours. Lipid Profile: No results for input(s): CHOL, HDL, LDLCALC, TRIG, CHOLHDL, LDLDIRECT in the last 72 hours. Thyroid Function Tests: Recent Labs    02/24/20 1430  TSH  1.060   Anemia Panel: No results for input(s): VITAMINB12, FOLATE, FERRITIN, TIBC, IRON, RETICCTPCT in the last 72 hours.    Radiology Studies: I have reviewed all of the imaging during this hospital visit personally     Scheduled Meds: . azithromycin  500 mg Oral Daily  . benzonatate  100 mg Oral TID  . dextromethorphan-guaiFENesin  1 tablet Oral BID  . heparin  5,000 Units Subcutaneous Q8H  . mycophenolate  540 mg Oral BID  . predniSONE  5 mg Oral Q breakfast  . saccharomyces boulardii  250 mg Oral BID  . sodium  chloride flush  3 mL Intravenous Q12H  . tacrolimus  0.5 mg Oral BID   Continuous Infusions: . cefTRIAXone (ROCEPHIN)  IV 1 g (02/24/20 2327)     LOS: 1 day        Lynden Oxford, MD

## 2020-02-26 LAB — GASTROINTESTINAL PANEL BY PCR, STOOL (REPLACES STOOL CULTURE)

## 2020-02-26 LAB — EXPECTORATED SPUTUM ASSESSMENT W GRAM STAIN, RFLX TO RESP C

## 2020-02-26 LAB — CBC WITH DIFFERENTIAL/PLATELET
Abs Immature Granulocytes: 0.02 10*3/uL (ref 0.00–0.07)
Basophils Absolute: 0 10*3/uL (ref 0.0–0.1)
Basophils Relative: 0 %
Eosinophils Absolute: 0.1 10*3/uL (ref 0.0–0.5)
Eosinophils Relative: 2 %
HCT: 41.8 % (ref 36.0–46.0)
Hemoglobin: 13.1 g/dL (ref 12.0–15.0)
Immature Granulocytes: 0 %
Lymphocytes Relative: 6 %
Lymphs Abs: 0.4 10*3/uL — ABNORMAL LOW (ref 0.7–4.0)
MCH: 32.1 pg (ref 26.0–34.0)
MCHC: 31.3 g/dL (ref 30.0–36.0)
MCV: 102.5 fL — ABNORMAL HIGH (ref 80.0–100.0)
Monocytes Absolute: 0.8 10*3/uL (ref 0.1–1.0)
Monocytes Relative: 9 %
Neutro Abs: 6.7 10*3/uL (ref 1.7–7.7)
Neutrophils Relative %: 83 %
Platelets: 120 10*3/uL — ABNORMAL LOW (ref 150–400)
RBC: 4.08 MIL/uL (ref 3.87–5.11)
RDW: 12.6 % (ref 11.5–15.5)
WBC: 8 10*3/uL (ref 4.0–10.5)
nRBC: 0 % (ref 0.0–0.2)

## 2020-02-26 LAB — COMPREHENSIVE METABOLIC PANEL
ALT: 32 U/L (ref 0–44)
AST: 33 U/L (ref 15–41)
Albumin: 3.1 g/dL — ABNORMAL LOW (ref 3.5–5.0)
Alkaline Phosphatase: 39 U/L (ref 38–126)
Anion gap: 9 (ref 5–15)
BUN: 21 mg/dL — ABNORMAL HIGH (ref 6–20)
CO2: 20 mmol/L — ABNORMAL LOW (ref 22–32)
Calcium: 9.5 mg/dL (ref 8.9–10.3)
Chloride: 108 mmol/L (ref 98–111)
Creatinine, Ser: 1.67 mg/dL — ABNORMAL HIGH (ref 0.44–1.00)
GFR calc Af Amer: 43 mL/min — ABNORMAL LOW (ref >60)
GFR calc non Af Amer: 37 mL/min — ABNORMAL LOW (ref >60)
Glucose, Bld: 119 mg/dL — ABNORMAL HIGH (ref 70–99)
Potassium: 4.5 mmol/L (ref 3.5–5.1)
Sodium: 137 mmol/L (ref 135–145)
Total Bilirubin: 0.8 mg/dL (ref 0.3–1.2)
Total Protein: 6.3 g/dL — ABNORMAL LOW (ref 6.5–8.1)

## 2020-02-26 LAB — MAGNESIUM: Magnesium: 1.5 mg/dL — ABNORMAL LOW (ref 1.7–2.4)

## 2020-02-26 MED ORDER — NITAZOXANIDE 500 MG PO TABS: 500.0000 mg | ORAL_TABLET | Freq: Two times a day (BID) | ORAL | Status: DC

## 2020-02-26 MED ORDER — NITAZOXANIDE 500 MG PO TABS
500.0000 mg | ORAL_TABLET | Freq: Two times a day (BID) | ORAL | Status: DC
  Administered 2020-02-26 – 2020-02-27 (×2): 500 mg via ORAL
  Filled 2020-02-26 (×3): qty 1

## 2020-02-26 MED ORDER — LOPERAMIDE HCL 2 MG PO CAPS
2.0000 mg | ORAL_CAPSULE | ORAL | Status: DC | PRN
  Administered 2020-02-26: 2 mg via ORAL
  Filled 2020-02-26: qty 1

## 2020-02-26 MED ORDER — MAGNESIUM SULFATE 2 GM/50ML IV SOLN
2.0000 g | Freq: Once | INTRAVENOUS | Status: AC
  Administered 2020-02-26: 2 g via INTRAVENOUS
  Filled 2020-02-26: qty 50

## 2020-02-26 NOTE — Progress Notes (Signed)
PROGRESS NOTE    Cynthia Pugh  NOI:[id number] DOB: March 19, 1976 DOA: 02/24/2020 PCP: Clide Dales, PA    Brief Narrative:  Patient was admitted to the hospital with a working diagnosis of sepsis due to urinary tract infection.  44 year old female with significant past medical history for systemic lupus erythematous, lupus nephritis status post kidney transplant, GERD, hypertension, and anemia.  She had COVID-18 July 2019.  She reported 3 days of nausea, vomiting, and diarrhea.  Associated with fever.  On her initial physical examination temperature 102.6, blood pressure 147/67, heart rate 105, respiratory rate 32, oxygen saturation 100% her lungs are clear to auscultation bilaterally, heart S1-S2, present rhythmic, her abdomen was soft nontender, no lower extremity edema. Sodium 135, potassium 4.2, chloride 105, bicarb 20, glucose 130, BUN 21, creatinine 2.62, white count 19.8, hemoglobin 14.7, hematocrit 46.9, platelets 122.  SARS COVID-19 negative.  Urinalysis 21-50 white cells, more than 50 red cells, specific gravity 1.019.  Protein more than 300.  Chest radiograph hypoinflated with congestive hilar vasculature.  EKG 106 bpm, normal axis, normal intervals, sinus rhythm, no ST segment or T wave changes.   Assessment & Plan:   Principal Problem:   Sepsis secondary to UTI [facility]) Active Problems:   Essential hypertension   Sepsis (HCC)   Lupus (HCC)   Lupus nephritis (HCC)   Renal transplant recipient   Anemia   1. Sepsis due to urine infection, (present on admission)/ end  Organ damage AKI. Patient continue to have leukocytosis, pending result of blood and urine culture. [place] transplanted kidney with no hydronephrosis or obstructive uropathy.   Continue antibiotic therapy IV ceftriaxone. Gentle hydration with isotonic saline. Follow up cell count, temperature curve and cultures.   2. Lupus/ lupus nephritis sp renal transplant/ AKI on CKD stage 2. No signs of lupus flare.  Renal function with serum cr at 2.5 to 2,6, her base cr is 1,1 to 1,2.   Renal function improving.  Continue to monitor. Diarrhea frequency is improving therefore anticipating improvement in renal function without any fluids.  3. Chronic anemia. Hgb stable.  4. Obesity class 3.  Body mass index is 52.56 kg/m.  Recommend outpatient follow-up. May require sleep apnea assessment as well.  5.  Diarrhea.  Due to Cryptosporidium C. difficile PCR is negative. GI pathogen panel was positive for Cryptosporidium. Due to immunosuppressed status we will treat with antibiotic.  Status is: Inpatient  Remains inpatient appropriate because:IV treatments appropriate due to intensity of illness or inability to take PO   Dispo: The patient is from: Home              Anticipated d/c is to: Home              Anticipated d/c date is: 2 days              Patient currently is not medically stable to d/c.   DVT prophylaxis: Enoxaparin   Code Status:   full  Family Communication:  No family at the bedside       Antimicrobials:   Dc vanc/ cefepime/ metronidazole  Start ceftriaxone     Subjective: No nausea no vomiting.  Continues to have fatigue and tiredness.  No fever no chills.  Had 2 episodes of loose BM this morning.  Episodes yesterday.  No blood.  Objective: Vitals:   02/25/20 2111 02/26/20 0500 02/26/20 0914 02/26/20 1717  BP: (!) 107/59 121/72 111/72 (!) 131/92  Pulse: 97 94 (!) 104 96  Resp:  19 18 18 18   Temp: 98.6 F (37 C) 98.5 F (36.9 C) 98.6 F (37 C) 98.7 F (37.1 C)  TempSrc: Oral  Oral Oral  SpO2: 97% 98% (!) 89% 93%  Weight: (!) 147.7 kg     Height:        Intake/Output Summary (Last 24 hours) at 02/26/2020 1944 Last data filed at 02/26/2020 1900 Gross per 24 hour  Intake 820 ml  Output 0 ml  Net 820 ml   Filed Weights   02/24/20 0230 02/24/20 2317 02/25/20 2111  Weight: (!) 149.7 kg (!) 147.7 kg (!) 147.7 kg    Examination:   General: deconditioned  and ill looking appearing  Neurology: Awake and alert, non focal  E ENT: mild pallor, no icterus, oral mucosa moist Cardiovascular: No JVD. S1-S2 present, rhythmic, no gallops, rubs, or murmurs. No lower extremity edema. Pulmonary: positive breath sounds bilaterally, adequate air movement, no wheezing, rhonchi or rales. Gastrointestinal. Abdomen protuberant with no organomegaly, non tender, no rebound or guarding Skin. No rashes Musculoskeletal: no joint deformities     Data Reviewed: I have personally reviewed following labs and imaging studies  CBC: Recent Labs  Lab 02/24/20 0313 02/24/20 0640 02/24/20 0921 02/25/20 0617 02/26/20 0738  WBC 19.8*  --  15.8* 14.7* 8.0  NEUTROABS 15.6*  --   --   --  6.7  HGB 14.7 14.3 13.2 14.4 13.1  HCT 46.9* 42.0 42.7 43.7 41.8  MCV 100.6*  --  104.9* 98.6 102.5*  PLT 122*  --  66* 91* 120*   Basic Metabolic Panel: Recent Labs  Lab 02/24/20 0313 02/24/20 0640 02/24/20 0921 02/25/20 0833 02/26/20 0738  NA 135 138  --  138 137  K 4.2 4.2  --  4.6 4.5  CL 105  --   --  108 108  CO2 20*  --   --  22 20*  GLUCOSE 130*  --   --  138* 119*  BUN 21*  --   --  24* 21*  CREATININE 2.62*  --  2.56* 2.29* 1.67*  CALCIUM 9.4  --   --  9.3 9.5  MG  --   --   --   --  1.5*   GFR: Estimated Creatinine Clearance: 65.6 mL/min (A) (by C-G formula based on SCr of 1.67 mg/dL (H)). Liver Function Tests: Recent Labs  Lab 02/24/20 0313 02/25/20 0833 02/26/20 0738  AST 44* 36 33  ALT 46* 33 32  ALKPHOS 49 47 39  BILITOT 1.9* 0.9 0.8  PROT 6.9 6.2* 6.3*  ALBUMIN 3.8 3.3* 3.1*   No results for input(s): LIPASE, AMYLASE in the last 168 hours. No results for input(s): AMMONIA in the last 168 hours. Coagulation Profile: Recent Labs  Lab 02/24/20 0313  INR 1.2   Cardiac Enzymes: No results for input(s): CKTOTAL, CKMB, CKMBINDEX, TROPONINI in the last 168 hours. BNP (last 3 results) No results for input(s): PROBNP in the last 8760  hours. HbA1C: No results for input(s): HGBA1C in the last 72 hours. CBG: No results for input(s): GLUCAP in the last 168 hours. Lipid Profile: No results for input(s): CHOL, HDL, LDLCALC, TRIG, CHOLHDL, LDLDIRECT in the last 72 hours. Thyroid Function Tests: Recent Labs    02/24/20 1430  TSH 1.060   Anemia Panel: No results for input(s): VITAMINB12, FOLATE, FERRITIN, TIBC, IRON, RETICCTPCT in the last 72 hours.    Radiology Studies: I have reviewed all of the imaging during this hospital visit personally  Scheduled Meds: . azithromycin  500 mg Oral Daily  . benzonatate  100 mg Oral TID  . dextromethorphan-guaiFENesin  1 tablet Oral BID  . heparin  5,000 Units Subcutaneous Q8H  . mycophenolate  540 mg Oral BID  . predniSONE  5 mg Oral Q breakfast  . saccharomyces boulardii  250 mg Oral BID  . sodium chloride flush  3 mL Intravenous Q12H  . tacrolimus  0.5 mg Oral BID   Continuous Infusions: . cefTRIAXone (ROCEPHIN)  IV 1 g (02/25/20 2255)     LOS: 2 days        Lynden Oxford, MD

## 2020-02-26 NOTE — Plan of Care (Signed)
  Problem: Education: Goal: Knowledge of General Education information will improve Description: Including pain rating scale, medication(s)/side effects and non-pharmacologic comfort measures Outcome: Progressing   Problem: Activity: Goal: Risk for activity intolerance will decrease Outcome: Progressing   Problem: Coping: Goal: Level of anxiety will decrease Outcome: Progressing   

## 2020-02-27 LAB — CBC
HCT: 39.7 % (ref 36.0–46.0)
Hemoglobin: 12.6 g/dL (ref 12.0–15.0)
MCH: 32.3 pg (ref 26.0–34.0)
MCHC: 31.7 g/dL (ref 30.0–36.0)
MCV: 101.8 fL — ABNORMAL HIGH (ref 80.0–100.0)
Platelets: 144 10*3/uL — ABNORMAL LOW (ref 150–400)
RBC: 3.9 MIL/uL (ref 3.87–5.11)
RDW: 12.4 % (ref 11.5–15.5)
WBC: 5.8 10*3/uL (ref 4.0–10.5)
nRBC: 0 % (ref 0.0–0.2)

## 2020-02-27 LAB — BASIC METABOLIC PANEL
Anion gap: 9 (ref 5–15)
BUN: 20 mg/dL (ref 6–20)
CO2: 21 mmol/L — ABNORMAL LOW (ref 22–32)
Calcium: 9.3 mg/dL (ref 8.9–10.3)
Chloride: 108 mmol/L (ref 98–111)
Creatinine, Ser: 1.59 mg/dL — ABNORMAL HIGH (ref 0.44–1.00)
GFR calc Af Amer: 46 mL/min — ABNORMAL LOW (ref >60)
GFR calc non Af Amer: 40 mL/min — ABNORMAL LOW (ref >60)
Glucose, Bld: 112 mg/dL — ABNORMAL HIGH (ref 70–99)
Potassium: 4.5 mmol/L (ref 3.5–5.1)
Sodium: 138 mmol/L (ref 135–145)

## 2020-02-27 LAB — MAGNESIUM: Magnesium: 1.8 mg/dL (ref 1.7–2.4)

## 2020-02-27 MED ORDER — LOPERAMIDE HCL 2 MG PO CAPS: 2.0000 mg | ORAL_CAPSULE | ORAL | 0 refills | Status: DC | PRN

## 2020-02-27 MED ORDER — BENZONATATE 100 MG PO CAPS: 100.0000 mg | ORAL_CAPSULE | Freq: Three times a day (TID) | ORAL | 0 refills | Status: AC

## 2020-02-27 MED ORDER — NITAZOXANIDE 500 MG PO TABS: 500.0000 mg | ORAL_TABLET | Freq: Two times a day (BID) | ORAL | 0 refills | Status: DC

## 2020-02-27 MED ORDER — BENZONATATE 100 MG PO CAPS: 100.0000 mg | ORAL_CAPSULE | Freq: Three times a day (TID) | ORAL | 0 refills | Status: DC

## 2020-02-27 MED ORDER — DM-GUAIFENESIN ER 30-600 MG PO TB12: 1.0000 | ORAL_TABLET | Freq: Two times a day (BID) | ORAL | 0 refills | Status: DC

## 2020-02-27 MED ORDER — LOPERAMIDE HCL 2 MG PO CAPS: 2.0000 mg | ORAL_CAPSULE | ORAL | 0 refills | Status: AC | PRN

## 2020-02-27 MED ORDER — NITAZOXANIDE 500 MG PO TABS: 500.0000 mg | ORAL_TABLET | Freq: Two times a day (BID) | ORAL | 0 refills | Status: AC

## 2020-02-27 MED ORDER — DM-GUAIFENESIN ER 30-600 MG PO TB12: 1.0000 | ORAL_TABLET | Freq: Two times a day (BID) | ORAL | 0 refills | Status: AC

## 2020-02-27 NOTE — Plan of Care (Signed)
  Problem: Education: Goal: Knowledge of General Education information will improve Description: Including pain rating scale, medication(s)/side effects and non-pharmacologic comfort measures Outcome: Progressing   Problem: Pain Managment: Goal: General experience of comfort will improve Outcome: Progressing   

## 2020-02-27 NOTE — Progress Notes (Signed)
NURSING PROGRESS NOTE  JONTE WOLLAM [id number] Discharge Data: 02/27/2020 1:55 PM Attending Provider: No att. providers found URK:YHCWCB, Kingsley Callander, PA     Melvia Heaps to be D/C'd Home per MD order.  Discussed with the patient the After Visit Summary and all questions fully answered. All IV's discontinued with no bleeding noted. All belongings returned to patient for patient to take home.   Last Vital Signs:  Blood pressure 121/89, pulse 96, temperature 98.6 F (37 C), temperature source Oral, resp. rate 20, height 5\' 6"  (1.676 m), weight (!) 147.7 kg, SpO2 96 %.  Discharge Medication List Allergies as of 02/27/2020      Reactions   Aspirin Other (See Comments)   Childhood seizures      Medication List    TAKE these medications   benzonatate 100 MG capsule Commonly known as: TESSALON Take 1 capsule (100 mg total) by mouth 3 (three) times daily. Notes to patient: 02/27/2020   dextromethorphan-guaiFENesin 30-600 MG 12hr tablet Commonly known as: MUCINEX DM Take 1 tablet by mouth 2 (two) times daily. Notes to patient: 02/27/2020   furosemide 40 MG tablet Commonly known as: LASIX Take 40 mg by mouth daily as needed for fluid or edema.   loperamide 2 MG capsule Commonly known as: IMODIUM Take 1 capsule (2 mg total) by mouth as needed for diarrhea or loose stools.   metoprolol tartrate 25 MG tablet Commonly known as: LOPRESSOR Take 37.5 mg by mouth 2 (two) times daily. Notes to patient: 02/27/2020   mycophenolate 180 MG EC tablet Commonly known as: MYFORTIC Take 540 mg by mouth 2 (two) times daily. Notes to patient: 02/27/2020   nitazoxanide 500 MG tablet Commonly known as: ALINIA Take 1 tablet (500 mg total) by mouth 2 (two) times daily with a meal for 12 days. Notes to patient: 02/27/2020   omeprazole 20 MG capsule Commonly known as: PRILOSEC Take 20 mg by mouth daily. Notes to patient: 02/28/2020   predniSONE 5 MG tablet Commonly known as: DELTASONE Take 5 mg by  mouth daily with breakfast. Notes to patient: 02/28/2020   sulfamethoxazole-trimethoprim 400-80 MG tablet Commonly known as: BACTRIM Take 1 tablet by mouth every Monday, Wednesday, and Friday. Notes to patient: 02/28/2020   tacrolimus 0.5 MG capsule Commonly known as: PROGRAF Take 0.5 mg by mouth 2 (two) times daily. Notes to patient: 02/27/2020

## 2020-02-28 LAB — CULTURE, RESPIRATORY W GRAM STAIN
Culture: NORMAL
Gram Stain: NONE SEEN

## 2020-02-29 LAB — CULTURE, BLOOD (ROUTINE X 2): Culture: NO GROWTH

## 2020-02-29 LAB — EXPECTORATED SPUTUM ASSESSMENT W GRAM STAIN, RFLX TO RESP C

## 2020-03-02 NOTE — Discharge Summary (Signed)
[facility] Discharge Summary   Patient: Cynthia Pugh UGQ:[id number]  PCP: Clide Dales, PA  Date of admission: 02/24/2020   Date of discharge: 02/27/2020      Discharge Diagnoses:  Principal Problem:   Sepsis secondary to UTI [facility]) Active Problems:   Essential hypertension   Sepsis (HCC)   Lupus (HCC)   Lupus nephritis (HCC)   Renal transplant recipient   Anemia cryptosporidium diarrhea   Admitted From: home Disposition:  Home   Recommendations for Outpatient Follow-up:  1. PCP: please follow up with PCP and transplant doctor 2. Follow up LABS/TEST:  none   Follow-up Information    Clide Dales, PA. Schedule an appointment as soon as possible for a visit in 1 week(s).   Specialty: Family Medicine Why: repeat BMP in 1 week.  Contact information: [address] [place] [address] [phone]              Diet recommendation: Cardiac diet  Activity: The patient is advised to gradually reintroduce usual activities, as tolerated  Discharge Condition: stable  Code Status: Full code   History of present illness: As per the H and P dictated on admission, "Cynthia Pugh is a 44 y.o. 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  Patient presents to ed with n/v/d x 3 days with associated fever at home. She states it symptoms started after cook this weekend. She notes no associated abdominal pain, however does note decrease po intake and appetite. She also states she has has increase joint pain. She notes interim history of increase leg swelling for which per nephrologist has placed her on standing lasix. She states she has note started this regimen as of yet.  She denies sob, or orthopnea, or chest pain ,dysuria,flank pain or urinary frequency. She denies any sick  contact."  Hospital Course:  Summary of her active problems in the hospital is as following. 1. Sepsis due to urine infection, (present on admission)/ end  Organ damage AKI.  [place] transplanted kidney with no hydronephrosis or obstructive uropathy.  Treated with antibiotic therapy IV ceftriaxone. Given Gentle hydration with isotonic saline.  Cultures on blood are negative  Urine culture grew multiple species  Continue oral Antibiotics   2. Lupus/ lupus nephritis sp renal transplant/ AKI on CKD stage 2. No signs of lupus flare. Renal function with serum cr at 2.5 to 2,6, her base cr is 1,1 to 1,2.  Renal function improving.  Continue to monitor. Diarrhea frequency is improving therefore anticipating improvement in renal function without any fluids.  3. Chronic anemia.  Hgb stable.  4. Obesity class 3.  Body mass index is 52.56 kg/m.  Recommend outpatient follow-up. May require sleep apnea assessment as well.  5.  Diarrhea.  Due to Cryptosporidium C. difficile PCR is negative. GI pathogen panel was positive for Cryptosporidium. Due to immunosuppressed status we will treat with antibiotic. I called pt's pharmacy at [facility] to verify the availability of the medication   Patient was ambulatory without any assistance. On the day of the discharge the patient's vitals were stable, and no other acute medical condition were reported by patient. the patient was felt safe to be discharge at Home with no therapy needed on discharge.  Consultants: none Procedures: noen  Discharge Exam: General: Appear in no distress, no Rash;  Oral Mucosa Clear, moist. Cardiovascular: S1 and S2 Present, no Murmur, Respiratory: normal respiratory effort, Bilateral Air entry present and no Crackles, no wheezes Abdomen: Bowel Sound present, Soft and no tenderness, no hernia Extremities: no Pedal edema, no calf tenderness Neurology: alert and oriented to time, place, and person affect  appropriate.  Filed Weights   02/24/20 0230 02/24/20 2317 02/25/20 2111  Weight: (!) 149.7 kg (!) 147.7 kg (!) 147.7 kg   Vitals:   02/27/20 0913 02/27/20 0920  BP: 121/89 121/89  Pulse: 97 96  Resp: 20 20  Temp: 98.6 F (37 C) 98.6 F (37 C)  SpO2: 95% 96%    DISCHARGE MEDICATION: Allergies as of 02/27/2020      Reactions   Aspirin Other (See Comments)   Childhood seizures      Medication List    TAKE these medications   benzonatate 100 MG capsule Commonly known as: TESSALON Take 1 capsule (100 mg total) by mouth 3 (three) times daily. Notes to patient: 02/27/2020   dextromethorphan-guaiFENesin 30-600 MG 12hr tablet Commonly known as: MUCINEX DM Take 1 tablet by mouth 2 (two) times daily. Notes to patient: 02/27/2020   furosemide 40 MG tablet Commonly known as: LASIX Take 40 mg by mouth daily as needed for fluid or edema.   loperamide 2 MG capsule Commonly known as: IMODIUM Take 1 capsule (2 mg total) by mouth as needed for diarrhea or loose stools.   metoprolol tartrate 25 MG tablet Commonly known as: LOPRESSOR Take 37.5 mg by mouth 2 (two) times daily. Notes to patient: 02/27/2020   mycophenolate 180 MG EC tablet Commonly known as: MYFORTIC Take 540 mg by mouth 2 (two) times daily. Notes to patient: 02/27/2020   nitazoxanide 500 MG tablet Commonly known as: ALINIA Take 1 tablet (500 mg total) by mouth 2 (two) times daily with a meal for 12 days. Notes to patient: 02/27/2020   omeprazole 20 MG capsule Commonly known as: PRILOSEC Take 20 mg by mouth daily. Notes to patient: 02/28/2020   predniSONE 5 MG tablet Commonly known as: DELTASONE Take 5 mg by mouth daily with breakfast. Notes to patient: 02/28/2020   sulfamethoxazole-trimethoprim 400-80 MG tablet Commonly known as: BACTRIM Take 1 tablet by mouth every Monday, Wednesday, and Friday. Notes to patient: 02/28/2020   tacrolimus 0.5 MG capsule Commonly known as: PROGRAF Take 0.5 mg by mouth 2 (two) times  daily. Notes to patient: 02/27/2020      Allergies  Allergen Reactions  . Aspirin Other (See Comments)    Childhood seizures   Discharge Instructions    Diet - low sodium heart healthy   Complete by: As directed    Increase activity slowly   Complete by: As directed       The results of significant diagnostics from this hospitalization (including imaging, microbiology, ancillary and laboratory) are listed below for reference.    Significant Diagnostic Studies: DG Chest 2 View  Result Date: 02/25/2020 CLINICAL DATA:  Cough and diarrhea for 3 days. EXAM: CHEST - 2 VIEW COMPARISON:  Single-view of the chest 02/24/2020. PA and lateral chest 07/16/2013. FINDINGS: Lung volumes are low. Mild linear atelectasis is present in left lung base. No consolidative process, pneumothorax or effusion. Heart size is upper normal. No acute or focal bony abnormality. IMPRESSION: No acute disease. Electronically Signed   By: Drusilla Kanner M.D.   On: 02/25/2020 13:33   US RENAL  Result Date: 02/24/2020 CLINICAL DATA:  Acute kidney injury EXAM: ULTRASOUND OF RENAL TRANSPLANT  TECHNIQUE: Ultrasound examination of the renal transplant was performed with gray-scale and color Doppler evaluation. COMPARISON:  06/03/2005 FINDINGS: Transplant kidney location:  Right lower quadrant Transplant kidney description: Renal measurements: 12.5 x 7.2 x 8.2 cm = volume: . Within normal limits in parenchymal echogenicity. No evidence of mass or hydronephrosis. No peri-transplant fluid collection seen. Color flow at the hilum: Present and bidirectional Bladder: Normal for degree of bladder distention. Other findings: Native renal atrophy on the right with nonvisualized left kidney. Echogenic liver as seen with hepatic steatosis. IMPRESSION: No hydronephrosis or collection at the transplant kidney. Electronically Signed   By: Marnee Spring M.D.   On: 02/24/2020 07:41   DG Chest Port 1 View  Result Date:  02/24/2020 CLINICAL DATA:  Fever, cough, history of renal transplant EXAM: PORTABLE CHEST 1 VIEW COMPARISON:  01/22/2020 FINDINGS: Single frontal view of the chest was obtained, limited by body habitus, portable technique, and patient positioning. Cardiac silhouette is stable. There is increased central vascular congestion without focal consolidation, effusion, or pneumothorax. No acute bony abnormalities. IMPRESSION: 1. Increased vascular congestion without overt edema. 2. No acute airspace disease. Electronically Signed   By: Sharlet Salina M.D.   On: 02/24/2020 02:57   DG Abd 2 Views  Result Date: 02/25/2020 CLINICAL DATA:  Abdominal pain and diarrhea for 3 days. EXAM: ABDOMEN - 2 VIEW COMPARISON:  None. FINDINGS: The bowel gas pattern is normal. There is no evidence of free air. No radio-opaque calculi or other significant radiographic abnormality is seen. IMPRESSION: Normal exam. Electronically Signed   By: Drusilla Kanner M.D.   On: 02/25/2020 13:34    Microbiology: Recent Results (from the past 240 hour(s))  Blood Culture (routine x 2)     Status: None   Collection Time: 02/24/20  2:38 AM   Specimen: BLOOD LEFT ARM  Result Value Ref Range Status   Specimen Description BLOOD LEFT ARM  Final   Special Requests   Final    BOTTLES DRAWN AEROBIC AND ANAEROBIC Blood Culture results may not be optimal due to an inadequate volume of blood received in culture bottles   Culture   Final    NO GROWTH 5 DAYS Performed at [facility], [address] [place], [place] [address]    Report Status 02/29/2020 FINAL  Final  SARS Coronavirus 2 by RT PCR (hospital order, performed in [facility] hospital lab) Nasopharyngeal Nasopharyngeal Swab     Status: None   Collection Time: 02/24/20  3:15 AM   Specimen: Nasopharyngeal Swab  Result Value Ref Range Status   SARS Coronavirus 2 NEGATIVE NEGATIVE Final    Comment: Performed at [facility], [address], [place], [place] [address]  Urine  culture     Status: Abnormal   Collection Time: 02/24/20  3:53 AM   Specimen: In/Out Cath Urine  Result Value Ref Range Status   Specimen Description IN/OUT CATH URINE  Final   Special Requests   Final    NONE Performed at [facility], [address], [place], [place] [address]    Culture MULTIPLE SPECIES PRESENT, SUGGEST RECOLLECTION (A)  Final   Report Status 02/25/2020 FINAL  Final  Culture, sputum-assessment     Status: None   Collection Time: 02/24/20  6:45 PM   Specimen: Expectorated Sputum  Result Value Ref Range Status   Specimen Description EXPECTORATED SPUTUM  Final   Special Requests NONE  Final   Sputum evaluation   Final  Sputum specimen not acceptable for testing.  Please recollect.   RESULT CALLED TO, READ BACK BY AND VERIFIED WITH: Langston MaskerH BUONKRONG RN 02/25/20 2226 JDW Performed at [facility], [address] [place], [place] [address]    Report Status 02/29/2020 FINAL  Final  C Difficile Quick Screen w PCR reflex     Status: None   Collection Time: 02/25/20  6:43 PM   Specimen: STOOL  Result Value Ref Range Status   C Diff antigen NEGATIVE NEGATIVE Final   C Diff toxin NEGATIVE NEGATIVE Final   C Diff interpretation No C. difficile detected.  Final    Comment: Performed at [facility], [address] [place], [place] [address]  Gastrointestinal Panel by PCR , Stool     Status: Abnormal   Collection Time: 02/25/20  6:43 PM   Specimen: STOOL  Result Value Ref Range Status   Campylobacter species NOT DETECTED NOT DETECTED Final   Plesimonas shigelloides NOT DETECTED NOT DETECTED Final   Salmonella species NOT DETECTED NOT DETECTED Final   Yersinia enterocolitica NOT DETECTED NOT DETECTED Final   Vibrio species NOT DETECTED NOT DETECTED Final   Vibrio cholerae NOT DETECTED NOT DETECTED Final   Enteroaggregative E coli (EAEC) NOT DETECTED NOT DETECTED Final   Enteropathogenic E coli (EPEC) NOT DETECTED NOT DETECTED Final   Enterotoxigenic E  coli (ETEC) NOT DETECTED NOT DETECTED Final   Shiga like toxin producing E coli (STEC) NOT DETECTED NOT DETECTED Final   Shigella/Enteroinvasive E coli (EIEC) NOT DETECTED NOT DETECTED Final   Cryptosporidium DETECTED (A) NOT DETECTED Final   Cyclospora cayetanensis NOT DETECTED NOT DETECTED Final   Entamoeba histolytica NOT DETECTED NOT DETECTED Final   Giardia lamblia NOT DETECTED NOT DETECTED Final   Adenovirus F40/41 NOT DETECTED NOT DETECTED Final   Astrovirus NOT DETECTED NOT DETECTED Final   Norovirus GI/GII NOT DETECTED NOT DETECTED Final   Rotavirus A NOT DETECTED NOT DETECTED Final   Sapovirus (I, II, IV, and V) NOT DETECTED NOT DETECTED Final    Comment: Performed at [facility], [address] [place], [place] [address]  Expectorated sputum assessment w rflx to resp cult     Status: None   Collection Time: 02/26/20  4:34 AM   Specimen: Sputum  Result Value Ref Range Status   Specimen Description SPUTUM  Final   Special Requests NONE  Final   Sputum evaluation   Final    THIS SPECIMEN IS ACCEPTABLE FOR SPUTUM CULTURE Performed at [facility], [address] [place], [place] [address]    Report Status 02/26/2020 FINAL  Final  Culture, respiratory     Status: None   Collection Time: 02/26/20  4:34 AM   Specimen: SPU  Result Value Ref Range Status   Specimen Description SPUTUM  Final   Special Requests NONE Reflexed from [id number]  Final   Gram Stain   Final    NO WBC SEEN RARE GRAM POSITIVE COCCI IN PAIRS IN CLUSTERS RARE GRAM VARIABLE ROD    Culture   Final    ABUNDANT Consistent with normal respiratory flora. Performed at [facility], [address] [place], [place] [address]    Report Status 02/28/2020 FINAL  Final     Labs: CBC: Recent Labs  Lab 02/24/20 0921 02/25/20 0617 02/26/20 0738 02/27/20 1014  WBC 15.8* 14.7* 8.0 5.8  NEUTROABS  --   --  6.7  --   HGB 13.2 14.4  13.1 12.6  HCT 42.7 43.7 41.8 39.7  MCV 104.9* 98.6  102.5* 101.8*  PLT 66* 91* 120* 144*   Basic Metabolic Panel: Recent Labs  Lab 02/24/20 0921 02/25/20 0833 02/26/20 0738 02/27/20 0400  NA  --  138 137 138  K  --  4.6 4.5 4.5  CL  --  108 108 108  CO2  --  22 20* 21*  GLUCOSE  --  138* 119* 112*  BUN  --  24* 21* 20  CREATININE 2.56* 2.29* 1.67* 1.59*  CALCIUM  --  9.3 9.5 9.3  MG  --   --  1.5* 1.8   Liver Function Tests: Recent Labs  Lab 02/25/20 0833 02/26/20 0738  AST 36 33  ALT 33 32  ALKPHOS 47 39  BILITOT 0.9 0.8  PROT 6.2* 6.3*  ALBUMIN 3.3* 3.1*   No results for input(s): LIPASE, AMYLASE in the last 168 hours. No results for input(s): AMMONIA in the last 168 hours. Cardiac Enzymes: No results for input(s): CKTOTAL, CKMB, CKMBINDEX, TROPONINI in the last 168 hours. BNP (last 3 results) Recent Labs    02/24/20 1438  BNP 225.0*   CBG: No results for input(s): GLUCAP in the last 168 hours.  Time spent: 35 minutes  Signed:  Lynden Oxford  [facility] 02/27/2020 7:44 AM

## 2021-11-10 IMAGING — US US RENAL
1 series · 14 of 25 positions shown · non-contrast
Comparison: 06/03/2005

CLINICAL DATA: Acute kidney injury

EXAM:
ULTRASOUND OF RENAL TRANSPLANT
TECHNIQUE: Ultrasound examination of the renal transplant was performed with
gray-scale and color Doppler evaluation.

[Series 1: us renal · 14 of 33 slices shown]
[im 1/33]
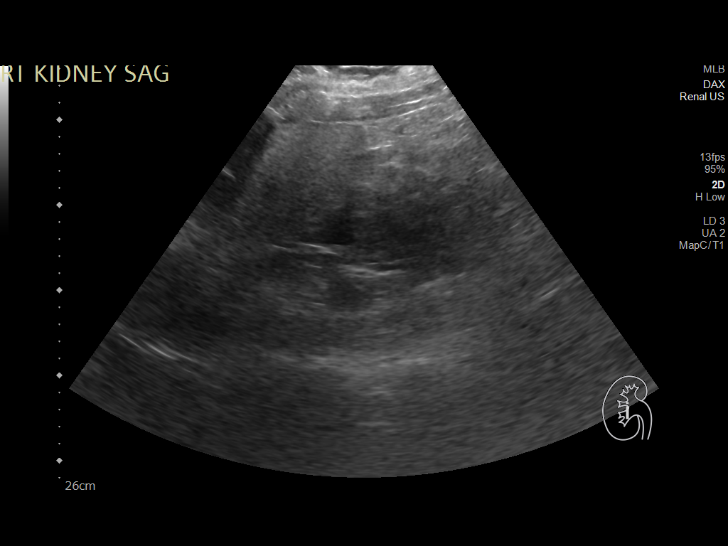
[im 3/33]
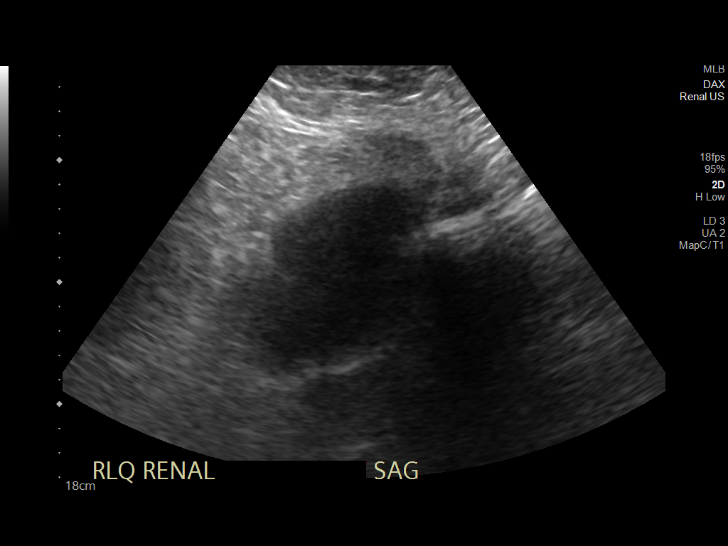
[im 6/33]
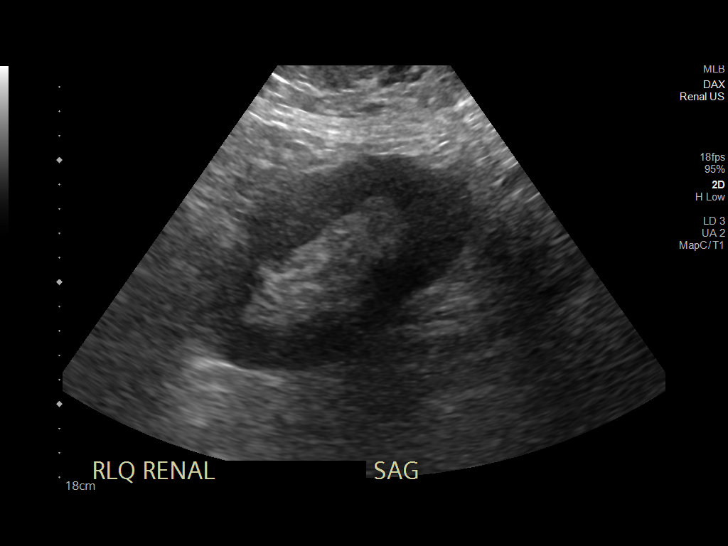
[im 9/33]
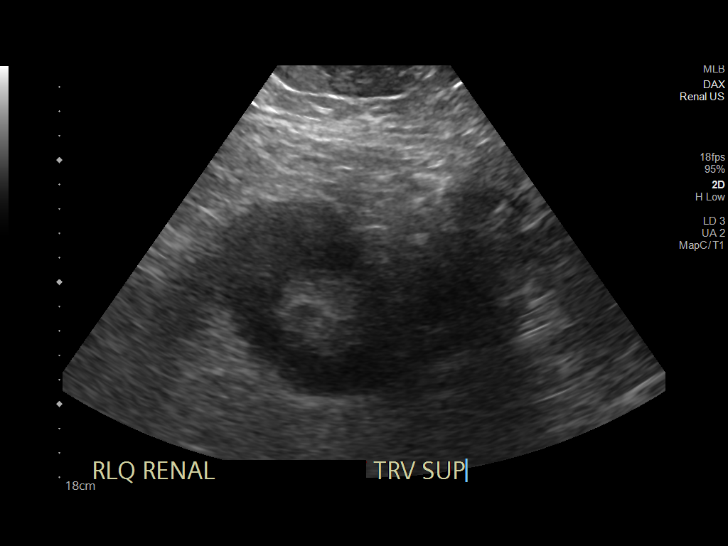
[im 11/33]
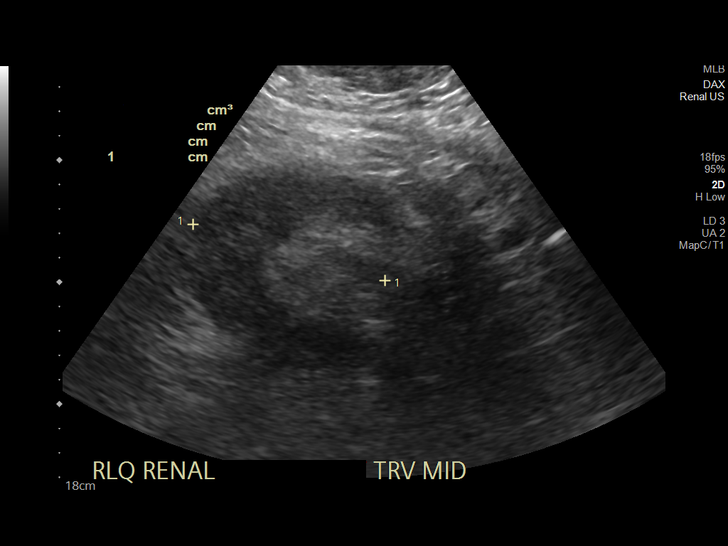
[im 13/33]
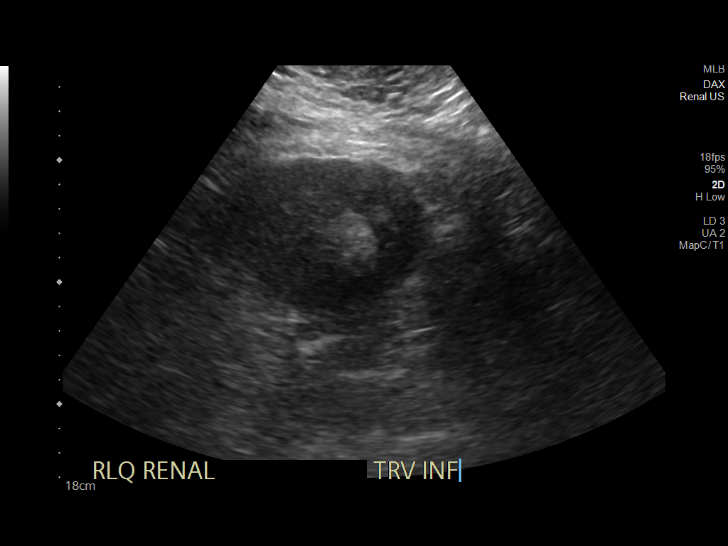
[im 15/33]
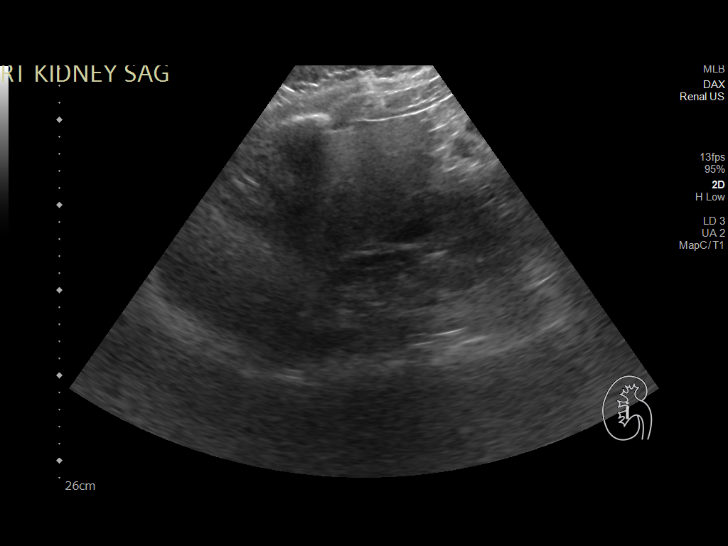
[im 18/33]
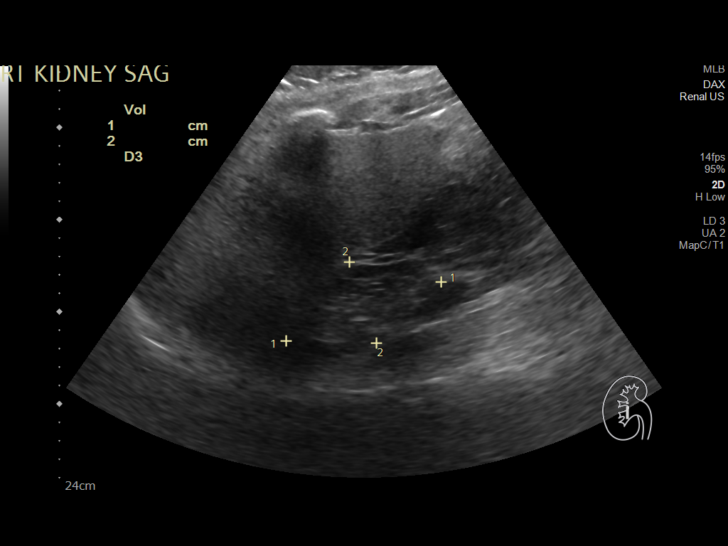
[im 21/33]
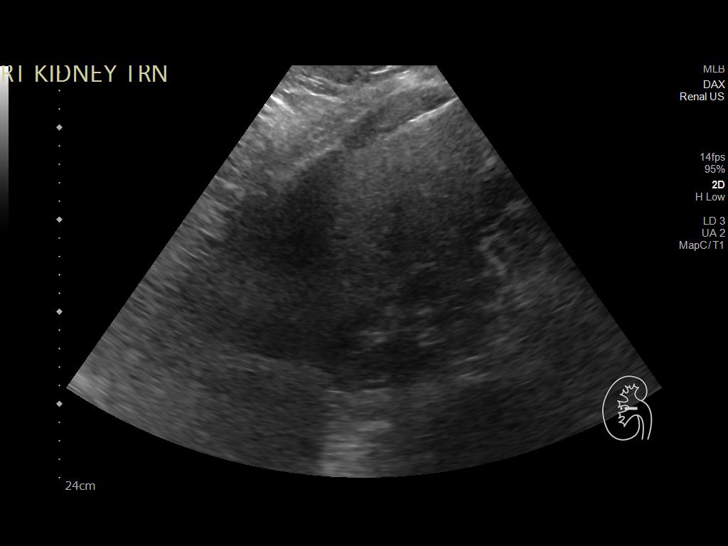
[im 22/33]
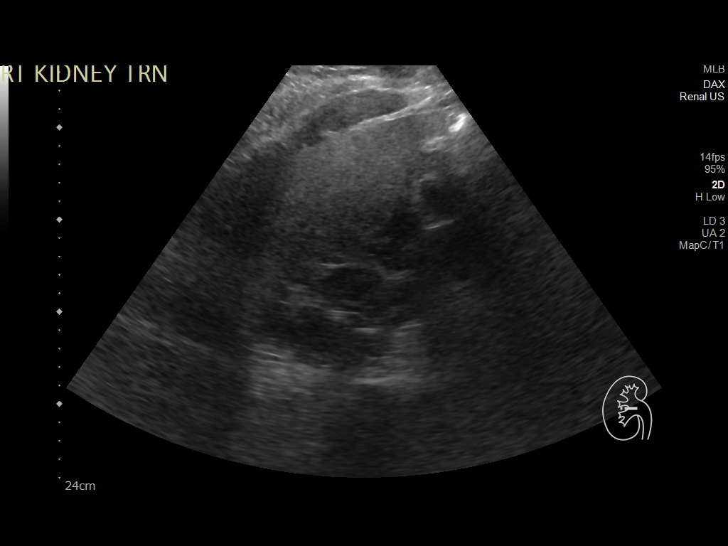
[im 25/33]
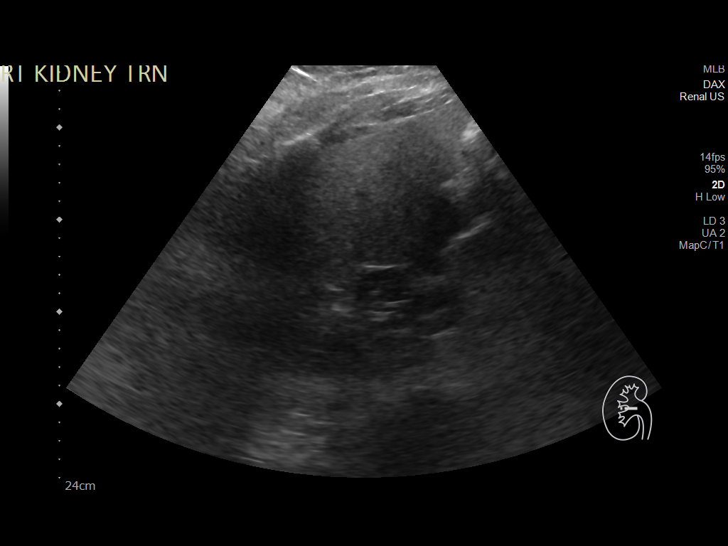
[im 27/33]
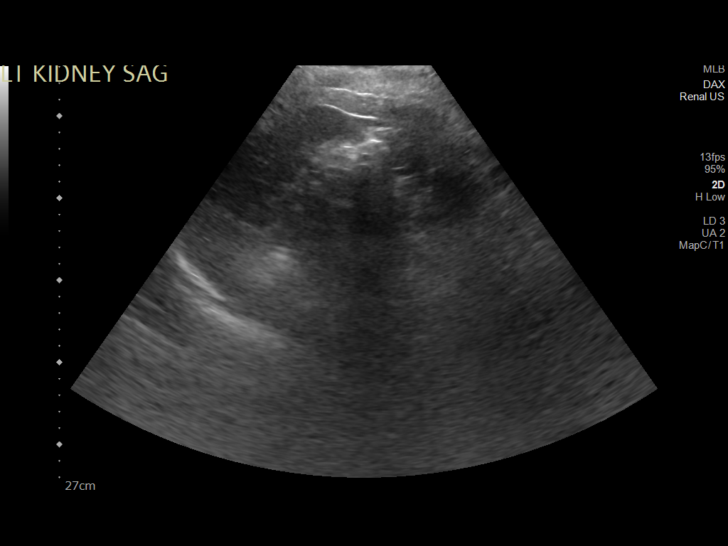
[im 30/33]
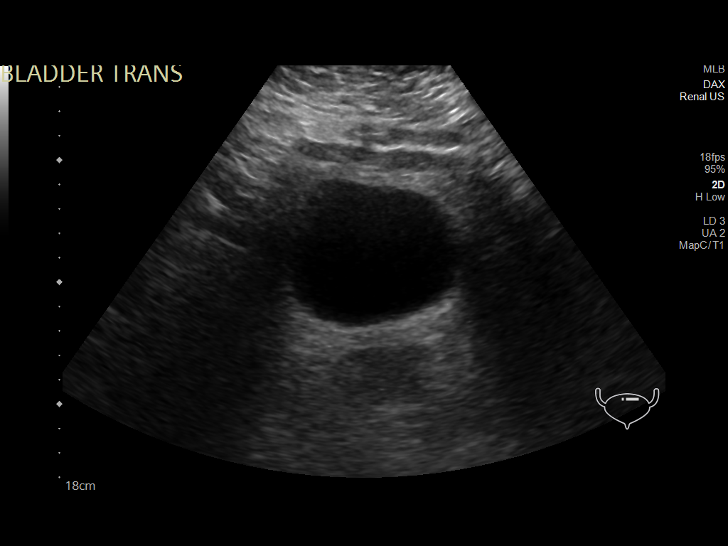
[im 33/33]
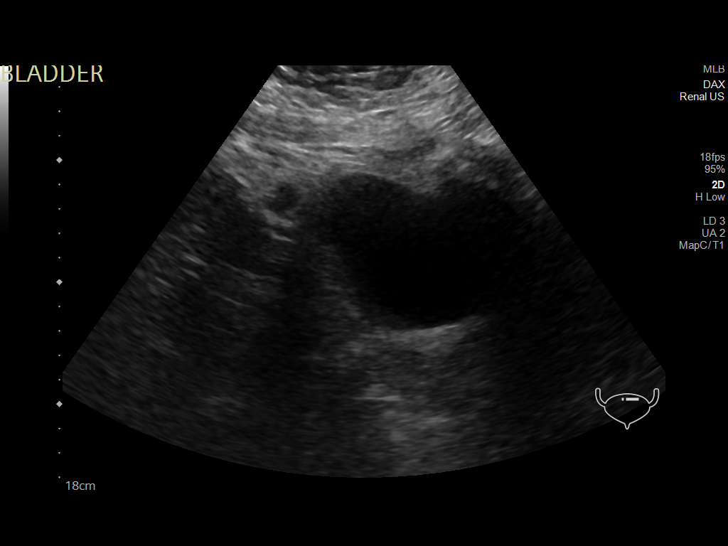

[14 of 25 positions shown; findings below may reference images not displayed]

FINDINGS: Transplant kidney location:  Right lower quadrant

Transplant kidney description: Renal measurements: 12.5 x 7.2 x
cm = volume: 385mL. Within normal limits in parenchymal
echogenicity. No evidence of mass or hydronephrosis. No
peri-transplant fluid collection seen.

Color flow at the hilum: Present and bidirectional

Bladder: Normal for degree of bladder distention.

Other findings: Native renal atrophy on the right with nonvisualized
left kidney. Echogenic liver as seen with hepatic steatosis.
IMPRESSION: No hydronephrosis or collection at the transplant kidney.

## 2021-11-11 IMAGING — CR DG ABDOMEN 2V
3 series · 3 of 3 positions shown · non-contrast
Comparison: None.

CLINICAL DATA: Abdominal pain and diarrhea for 3 days.

EXAM:
ABDOMEN - 2 VIEW

[abdomen erect]
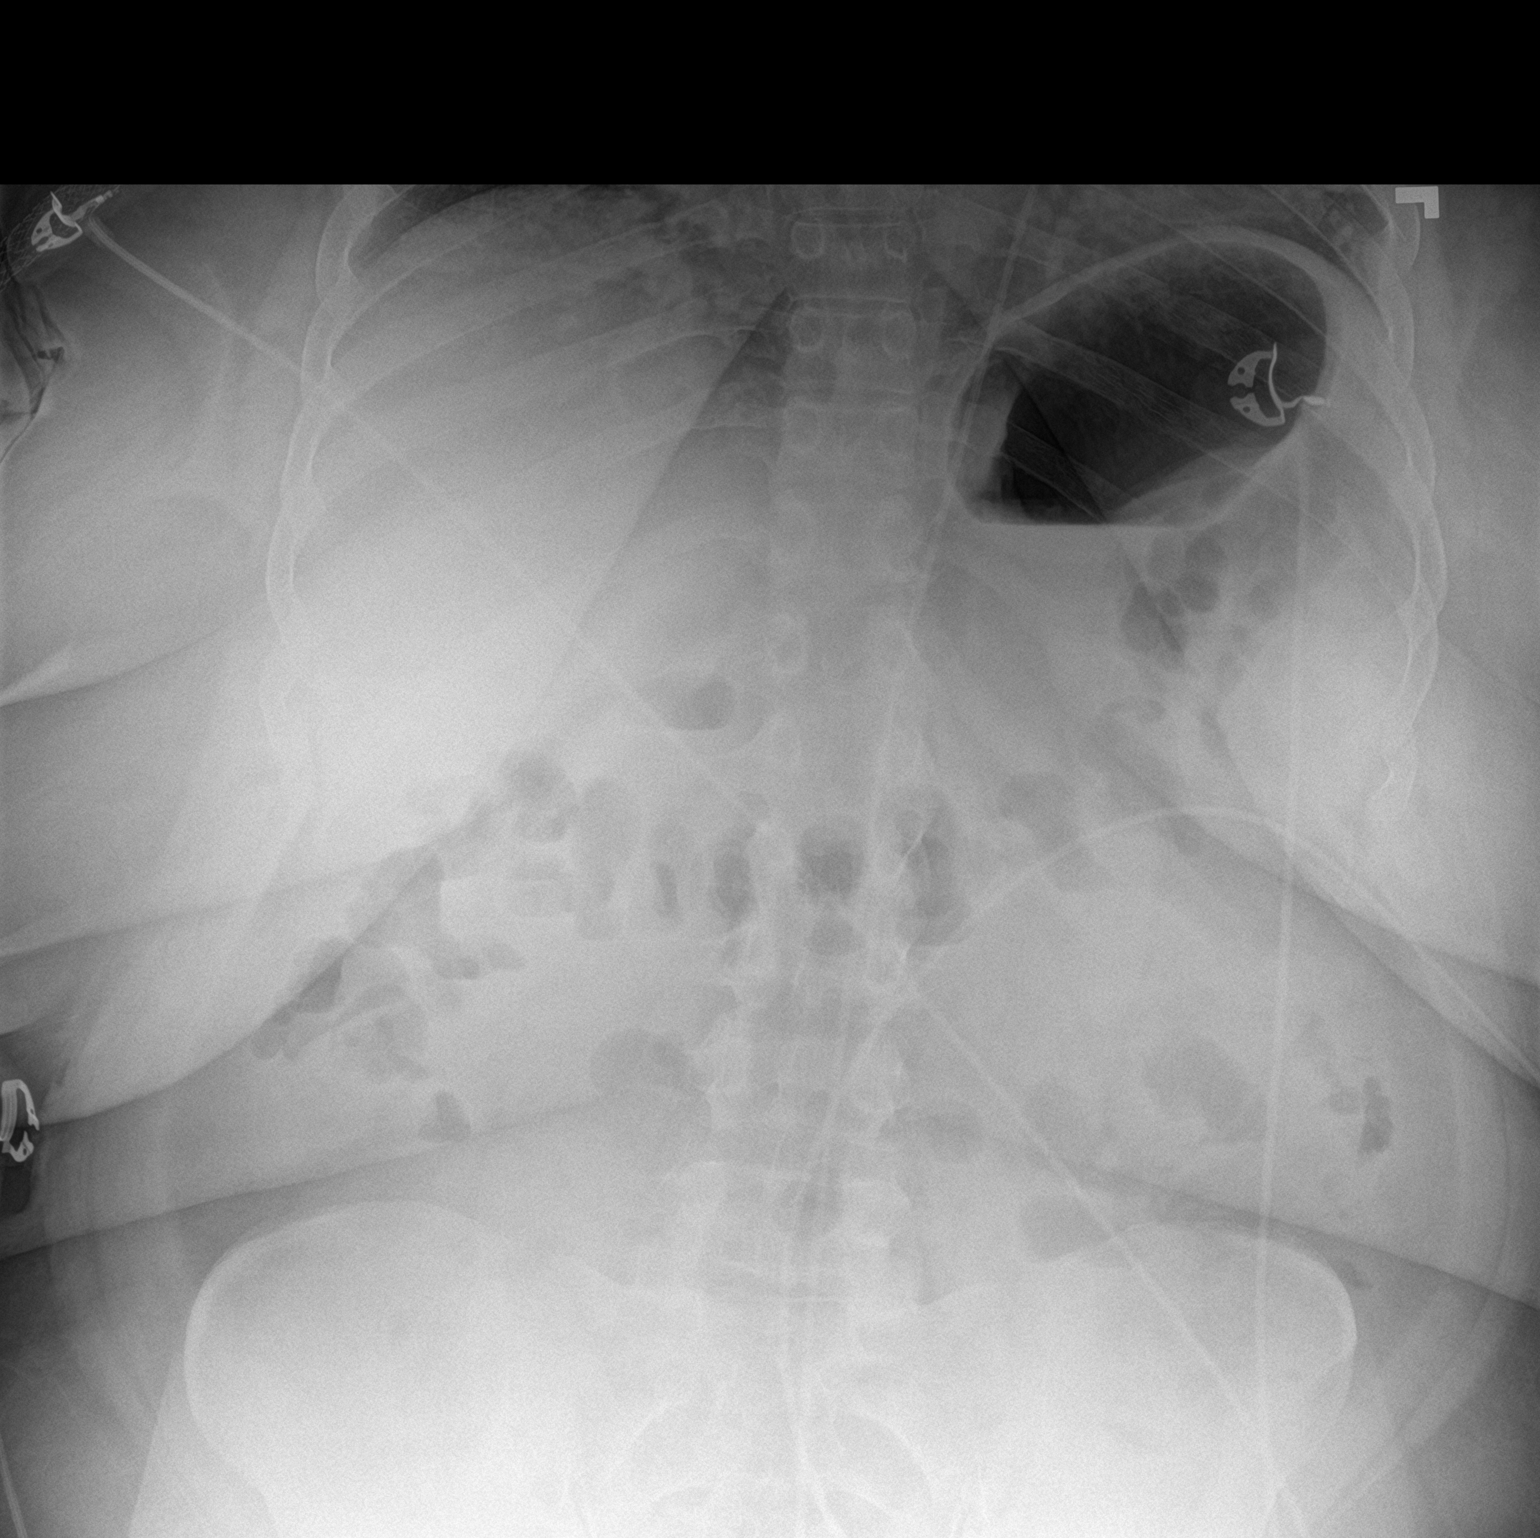

[abdomen supine (1 of 2)]
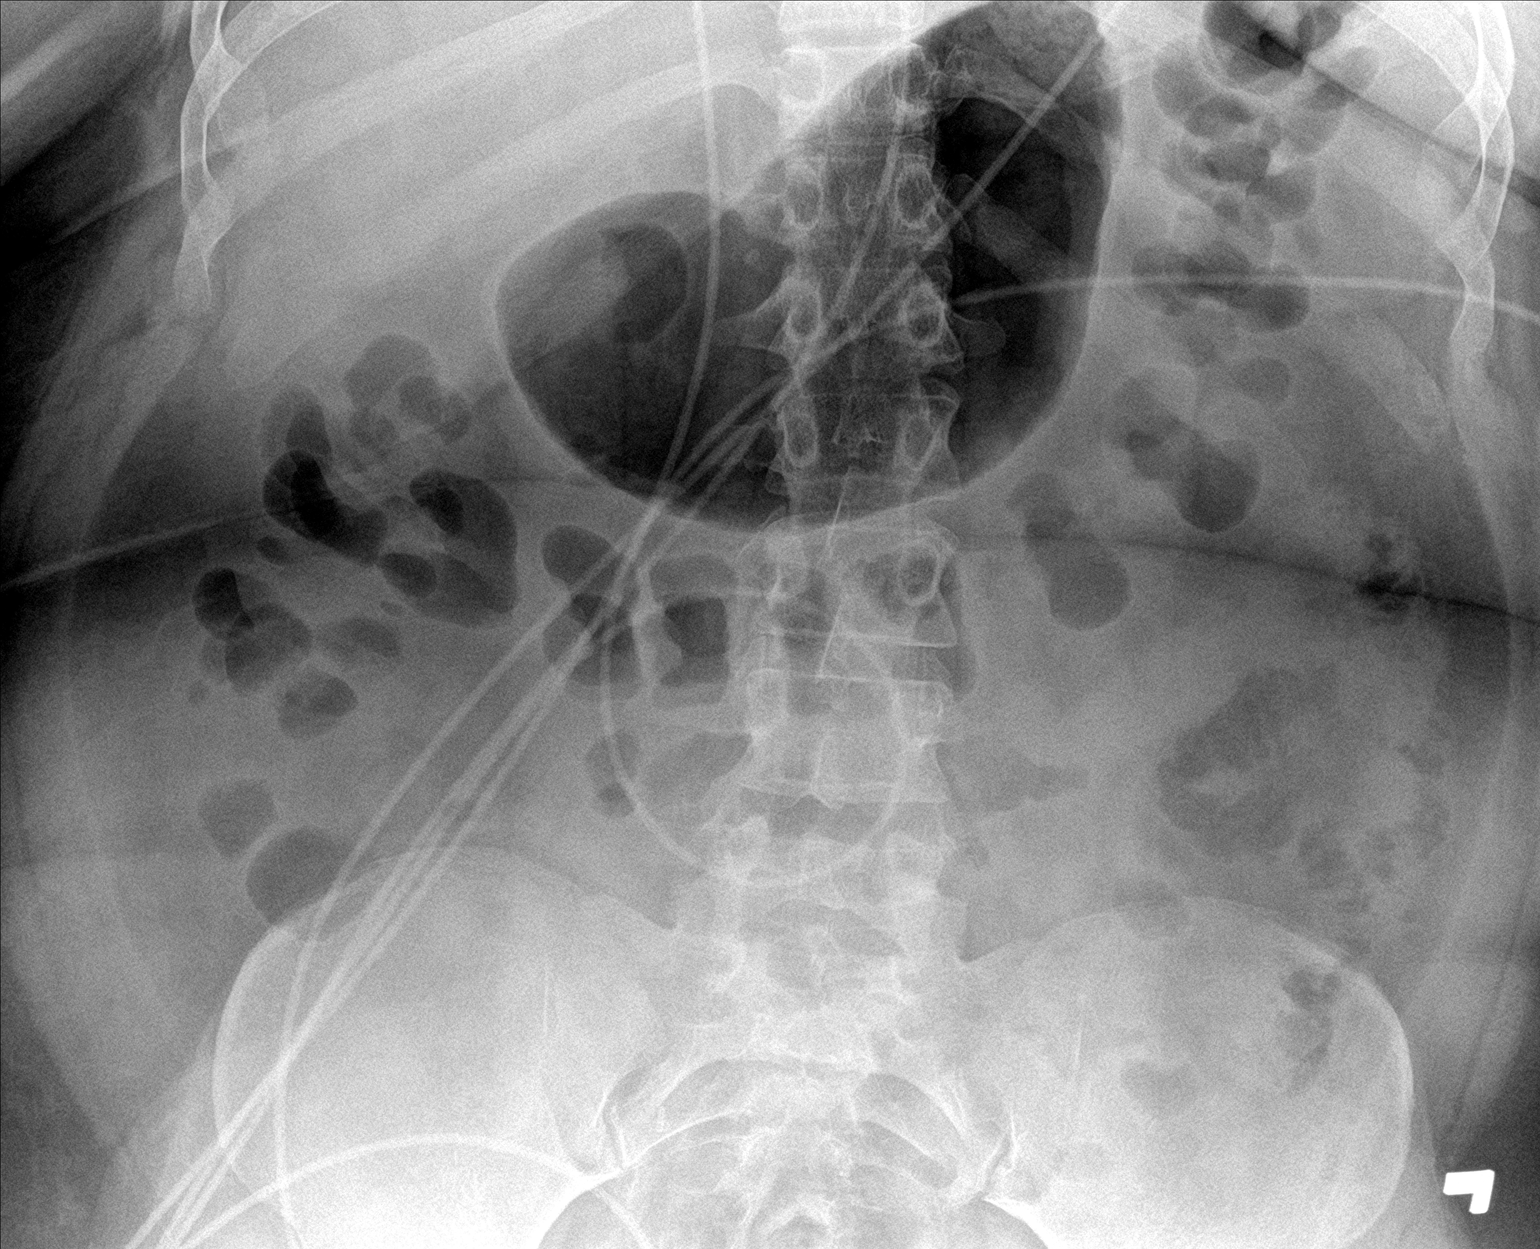

[abdomen supine (2 of 2)]
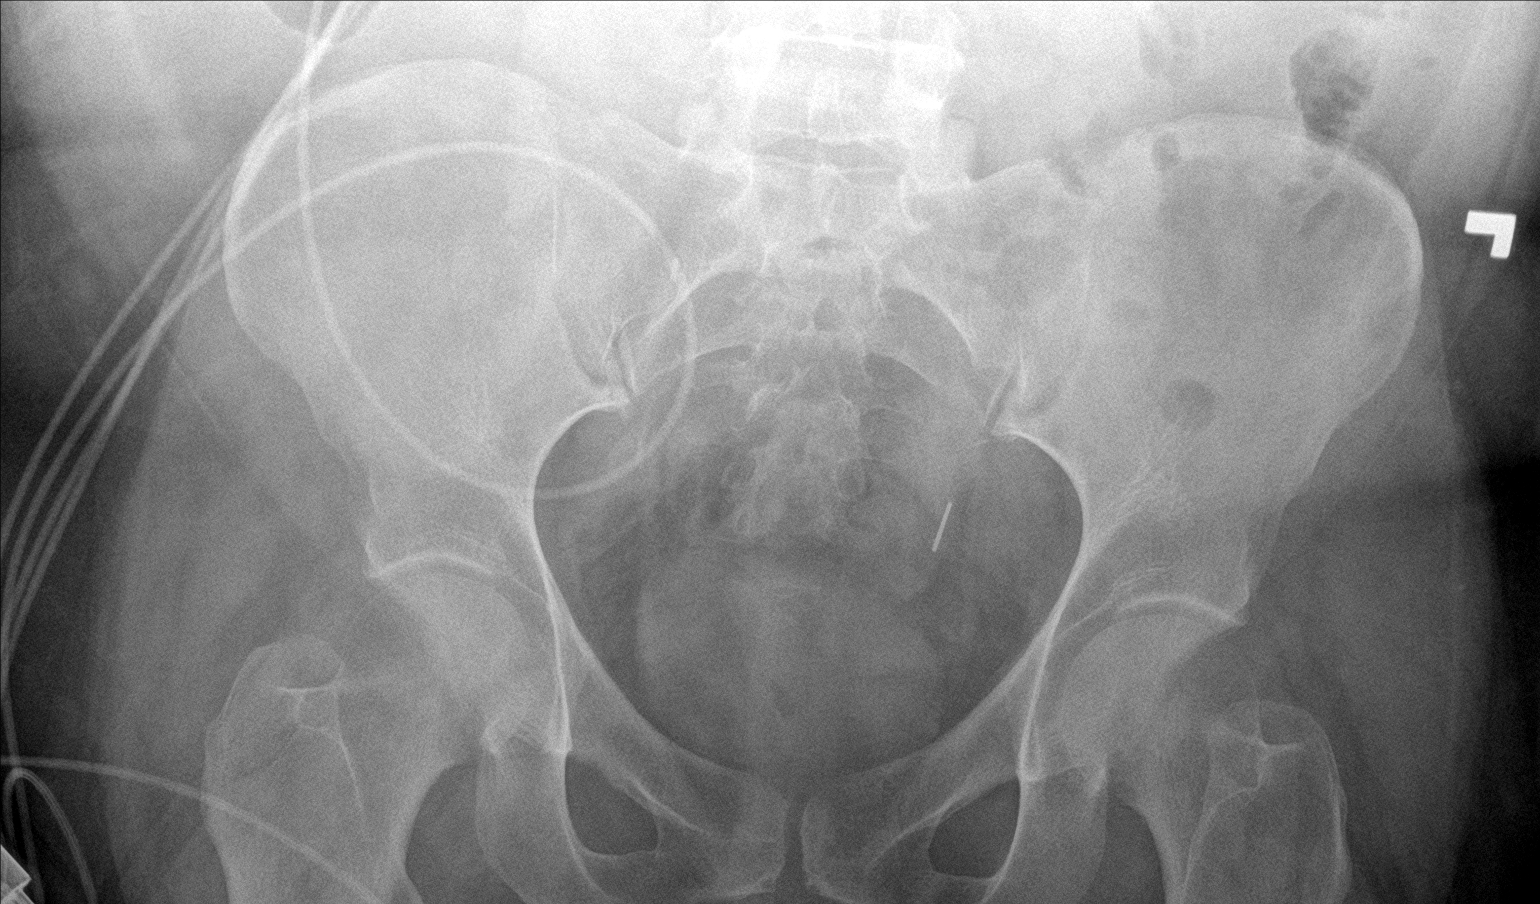

[3 of 3 positions shown; findings below may reference images not displayed]

FINDINGS: The bowel gas pattern is normal. There is no evidence of free air.
No radio-opaque calculi or other significant radiographic
abnormality is seen.
IMPRESSION: Normal exam.
